# Patient Record
Sex: Male | Born: 1975 | Race: White | Hispanic: No | Marital: Married | Smoking: Former smoker
Health system: Southern US, Community
[De-identification: ages and names within clinical notes are randomized; demographics above are authoritative.]

## PROBLEM LIST (undated history)

## (undated) DIAGNOSIS — H9319 Tinnitus, unspecified ear: Secondary | ICD-10-CM

## (undated) DIAGNOSIS — F32A Depression, unspecified: Secondary | ICD-10-CM

## (undated) DIAGNOSIS — F419 Anxiety disorder, unspecified: Secondary | ICD-10-CM

## (undated) DIAGNOSIS — E785 Hyperlipidemia, unspecified: Secondary | ICD-10-CM

## (undated) HISTORY — DX: Depression, unspecified: F32.A

## (undated) HISTORY — DX: Hyperlipidemia, unspecified: E78.5

## (undated) HISTORY — PX: WISDOM TOOTH EXTRACTION: SHX21

## (undated) HISTORY — PX: ELBOW SURGERY: SHX618

## (undated) HISTORY — PX: OTHER SURGICAL HISTORY: SHX169

## (undated) HISTORY — PX: VASECTOMY: SHX75

## (undated) HISTORY — DX: Tinnitus, unspecified ear: H93.19

## (undated) HISTORY — PX: TOE SURGERY: SHX1073

## (undated) HISTORY — DX: Anxiety disorder, unspecified: F41.9

---

## 2012-08-04 DIAGNOSIS — Z23 Encounter for immunization: Secondary | ICD-10-CM

## 2013-08-19 ENCOUNTER — Ambulatory Visit (INDEPENDENT_AMBULATORY_CARE_PROVIDER_SITE_OTHER): Payer: BC Managed Care – PPO

## 2013-08-19 DIAGNOSIS — Z23 Encounter for immunization: Secondary | ICD-10-CM

## 2017-03-25 DIAGNOSIS — L918 Other hypertrophic disorders of the skin: Secondary | ICD-10-CM | POA: Diagnosis not present

## 2017-03-25 DIAGNOSIS — L820 Inflamed seborrheic keratosis: Secondary | ICD-10-CM | POA: Diagnosis not present

## 2017-04-22 ENCOUNTER — Ambulatory Visit (INDEPENDENT_AMBULATORY_CARE_PROVIDER_SITE_OTHER): Payer: 59 | Admitting: Psychology

## 2017-04-22 DIAGNOSIS — F3341 Major depressive disorder, recurrent, in partial remission: Secondary | ICD-10-CM | POA: Diagnosis not present

## 2017-05-07 ENCOUNTER — Ambulatory Visit (INDEPENDENT_AMBULATORY_CARE_PROVIDER_SITE_OTHER): Payer: 59 | Admitting: Psychology

## 2017-05-07 DIAGNOSIS — F3341 Major depressive disorder, recurrent, in partial remission: Secondary | ICD-10-CM

## 2017-08-18 DIAGNOSIS — Z0000 Encounter for general adult medical examination without abnormal findings: Secondary | ICD-10-CM | POA: Diagnosis not present

## 2017-08-18 DIAGNOSIS — Z125 Encounter for screening for malignant neoplasm of prostate: Secondary | ICD-10-CM | POA: Diagnosis not present

## 2017-08-24 ENCOUNTER — Ambulatory Visit (INDEPENDENT_AMBULATORY_CARE_PROVIDER_SITE_OTHER): Payer: 59 | Admitting: Psychology

## 2017-08-24 DIAGNOSIS — Z0000 Encounter for general adult medical examination without abnormal findings: Secondary | ICD-10-CM | POA: Diagnosis not present

## 2017-08-24 DIAGNOSIS — Z125 Encounter for screening for malignant neoplasm of prostate: Secondary | ICD-10-CM | POA: Diagnosis not present

## 2017-08-24 DIAGNOSIS — Z1389 Encounter for screening for other disorder: Secondary | ICD-10-CM | POA: Diagnosis not present

## 2017-08-24 DIAGNOSIS — F331 Major depressive disorder, recurrent, moderate: Secondary | ICD-10-CM

## 2017-08-24 DIAGNOSIS — Z23 Encounter for immunization: Secondary | ICD-10-CM | POA: Diagnosis not present

## 2017-08-24 DIAGNOSIS — E7849 Other hyperlipidemia: Secondary | ICD-10-CM | POA: Diagnosis not present

## 2017-09-07 ENCOUNTER — Ambulatory Visit (INDEPENDENT_AMBULATORY_CARE_PROVIDER_SITE_OTHER): Payer: 59 | Admitting: Psychology

## 2017-09-07 DIAGNOSIS — F321 Major depressive disorder, single episode, moderate: Secondary | ICD-10-CM | POA: Diagnosis not present

## 2017-09-21 ENCOUNTER — Ambulatory Visit (INDEPENDENT_AMBULATORY_CARE_PROVIDER_SITE_OTHER): Payer: 59 | Admitting: Psychology

## 2017-09-21 DIAGNOSIS — F331 Major depressive disorder, recurrent, moderate: Secondary | ICD-10-CM | POA: Diagnosis not present

## 2017-10-05 ENCOUNTER — Ambulatory Visit (INDEPENDENT_AMBULATORY_CARE_PROVIDER_SITE_OTHER): Payer: 59 | Admitting: Psychology

## 2017-10-05 DIAGNOSIS — F331 Major depressive disorder, recurrent, moderate: Secondary | ICD-10-CM | POA: Diagnosis not present

## 2017-10-12 ENCOUNTER — Ambulatory Visit: Payer: Self-pay | Admitting: Emergency Medicine

## 2017-10-12 VITALS — BP 136/92 | HR 92 | Temp 98.4°F | Resp 18 | Wt 207.6 lb

## 2017-10-12 DIAGNOSIS — H1033 Unspecified acute conjunctivitis, bilateral: Secondary | ICD-10-CM

## 2017-10-12 DIAGNOSIS — L309 Dermatitis, unspecified: Secondary | ICD-10-CM

## 2017-10-12 MED ORDER — TRIAMCINOLONE ACETONIDE 0.1 % EX CREA: 1.0000 "application " | TOPICAL_CREAM | Freq: Two times a day (BID) | CUTANEOUS | 0 refills | Status: DC

## 2017-10-12 MED ORDER — TOBRAMYCIN 0.3 % OP SOLN: 2.0000 [drp] | OPHTHALMIC | 0 refills | Status: DC

## 2017-10-12 NOTE — Patient Instructions (Signed)
Rash A rash is a change in the color of the skin. A rash can also change the way your skin feels. There are many different conditions and factors that can cause a rash. Follow these instructions at home: Pay attention to any changes in your symptoms. Follow these instructions to help with your condition: Medicine Take or apply over-the-counter and prescription medicines only as told by your doctor. These may include:  Corticosteroid cream.  Anti-itch lotions.  Oral antihistamines.  Skin Care  Put cool compresses on the affected areas.  Try taking a bath with: ? Epsom salts. Follow the instructions on the packaging. You can get these at your local pharmacy or grocery store. ? Baking soda. Pour a small amount into the bath as told by your doctor. ? Colloidal oatmeal. Follow the instructions on the packaging. You can get this at your local pharmacy or grocery store.  Try putting baking soda paste onto your skin. Stir water into baking soda until it gets like a paste.  Do not scratch or rub your skin.  Avoid covering the rash. Make sure the rash is exposed to air as much as possible. General instructions  Avoid hot showers or baths, which can make itching worse. A cold shower may help.  Avoid scented soaps, detergents, and perfumes. Use gentle soaps, detergents, perfumes, and other cosmetic products.  Avoid anything that causes your rash. Keep a journal to help track what causes your rash. Write down: ? What you eat. ? What cosmetic products you use. ? What you drink. ? What you wear. This includes jewelry.  Keep all follow-up visits as told by your doctor. This is important. Contact a doctor if:  You sweat at night.  You lose weight.  You pee (urinate) more than normal.  You feel weak.  You throw up (vomit).  Your skin or the whites of your eyes look yellow (jaundice).  Your skin: ? Tingles. ? Is numb.  Your rash: ? Does not go away after a few days. ? Gets  worse.  You are: ? More thirsty than normal. ? More tired than normal.  You have: ? New symptoms. ? Pain in your belly (abdomen). ? A fever. ? Watery poop (diarrhea). Get help right away if:  Your rash covers all or most of your body. The rash may or may not be painful.  You have blisters that: ? Are on top of the rash. ? Grow larger. ? Grow together. ? Are painful. ? Are inside your nose or mouth.  You have a rash that: ? Looks like purple pinprick-sized spots all over your body. ? Has a "bull's eye" or looks like a target. ? Is red and painful, causes your skin to peel, and is not from being in the sun too long. This information is not intended to replace advice given to you by your health care provider. Make sure you discuss any questions you have with your health care provider. Document Released: 04/14/2008 Document Revised: 04/03/2016 Document Reviewed: 03/14/2015 Elsevier Interactive Patient Education  2018 [employer]. Allergic Conjunctivitis A clear membrane (conjunctiva) covers the white part of your eye and the inner surface of your eyelid. Allergic conjunctivitis happens when this membrane has inflammation. This is caused by allergies. Common causes of allergic reactions (allergens)include:  Outdoor allergens, such as: ? Pollen. ? Grass and weeds. ? Mold spores.  Indoor allergens, such as: ? Dust. ? Smoke. ? Mold. ? Pet dander. ? Animal hair.  This condition can make  your eye red or pink. It can also make your eye feel itchy. This condition cannot be spread from one person to another person (is not contagious). Follow these instructions at home:  Try not to be around things that you are allergic to.  Take or apply over-the-counter and prescription medicines only as told by your doctor. These include any eye drops.  Place a cool, clean washcloth on your eye for 10-20 minutes. Do this 3-4 times a day.  Do not touch or rub your eyes.  Do not wear  contact lenses until the inflammation is gone. Wear glasses instead.  Do not wear eye makeup until the inflammation is gone.  Keep all follow-up visits as told by your doctor. This is important. Contact a doctor if:  Your symptoms get worse.  Your symptoms do not get better with treatment.  You have mild eye pain.  You are sensitive to light,  You have spots or blisters on your eyes.  You have pus coming from your eye.  You have a fever. Get help right away if:  You have redness, swelling, or other symptoms in only one eye.  Your vision is blurry.  You have vision changes.  You have very bad eye pain. Summary  Allergic conjunctivitis is caused by allergies. It can make your eye red or pink, and it can make your eye feel itchy.  This condition cannot be spread from one person to another person (is not contagious).  Try not to be around things that you are allergic to.  Take or apply over-the-counter and prescription medicines only as told by your doctor. These include any eye drops.  Contact your doctor if your symptoms get worse or they do not get better with treatment. This information is not intended to replace advice given to you by your health care provider. Make sure you discuss any questions you have with your health care provider. Document Released: 04/16/2010 Document Revised: 06/20/2016 Document Reviewed: 06/20/2016 Elsevier Interactive Patient Education  2017 [employer].

## 2017-10-12 NOTE — Progress Notes (Signed)
Subjective:    Joel Mayer is a 41 y.o. male who presents for evaluation of discharge, erythema, tearing and crusting in both eyes. He has noticed the above symptoms for 2 days. Onset was acute. Patient denies blurred vision, foreign body sensation, pain and visual field deficit. There is a history of allergies. He does not wear glasses. He reports his daughter has conjunctivitis and is being treated with antibiotic eye drops for 3 days.  He has a secondary complaint of rash to the left antecubital area following giving blood last week. Describes it as itchy, denies pain. No new soaps, detergents, cloths, or other sources of allergens.    Review of Systems Pertinent items are noted in HPI.   Objective:    BP (!) 136/92 (BP Location: Right Arm, Patient Position: Sitting, Cuff Size: Normal)   Pulse 92   Temp 98.4 F (36.9 C) (Oral)   Resp 18   Wt 207 lb 9.6 oz (94.2 kg)   SpO2 98%        Physical Exam  Constitutional: He is well-developed, well-nourished, and in no distress. No distress.  HENT:  Head: Normocephalic and atraumatic.  Right Ear: External ear normal.  Left Ear: External ear normal.  Eyes: EOM are normal. Pupils are equal, round, and reactive to light. Right eye exhibits no chemosis and no discharge. Left eye exhibits no chemosis and no discharge. Right conjunctiva is injected. Right conjunctiva has no hemorrhage. Left conjunctiva is injected. Left conjunctiva has no hemorrhage.  Visual acuity  L 20/20 R 20/20 B 20/10  Neck: Neck supple.  Cardiovascular: Regular rhythm.  Lymphadenopathy:    He has no cervical adenopathy.  Skin: Skin is warm and dry. Rash noted. Rash is maculopapular. He is not diaphoretic.  Nursing note and vitals reviewed.    Assessment:      1. Acute bacterial conjunctivitis of both eyes   2. Dermatitis      Plan:   1. Acute bacterial conjunctivitis of both eyes - tobramycin (TOBREX) 0.3 % ophthalmic solution; Place 2 drops into both  eyes every 4 (four) hours. While awake  Dispense: 5 mL; Refill: 0  2. Dermatitis - triamcinolone cream (KENALOG) 0.1 %; Apply 1 application topically 2 (two) times daily. Apply to the affected area twice daily.  Dispense: 30 g; Refill: 0

## 2017-10-14 ENCOUNTER — Telehealth: Payer: Self-pay | Admitting: Emergency Medicine

## 2017-10-19 ENCOUNTER — Ambulatory Visit: Payer: 59 | Admitting: Psychology

## 2017-11-16 ENCOUNTER — Ambulatory Visit (INDEPENDENT_AMBULATORY_CARE_PROVIDER_SITE_OTHER): Payer: 59 | Admitting: Psychology

## 2017-11-16 DIAGNOSIS — F331 Major depressive disorder, recurrent, moderate: Secondary | ICD-10-CM | POA: Diagnosis not present

## 2017-11-30 ENCOUNTER — Ambulatory Visit: Payer: 59 | Admitting: Psychology

## 2017-12-06 DIAGNOSIS — M25561 Pain in right knee: Secondary | ICD-10-CM | POA: Diagnosis not present

## 2017-12-08 DIAGNOSIS — M25561 Pain in right knee: Secondary | ICD-10-CM | POA: Diagnosis not present

## 2017-12-10 DIAGNOSIS — M25561 Pain in right knee: Secondary | ICD-10-CM | POA: Diagnosis not present

## 2017-12-14 ENCOUNTER — Ambulatory Visit (INDEPENDENT_AMBULATORY_CARE_PROVIDER_SITE_OTHER): Payer: 59 | Admitting: Psychology

## 2017-12-14 DIAGNOSIS — F331 Major depressive disorder, recurrent, moderate: Secondary | ICD-10-CM

## 2017-12-24 DIAGNOSIS — E7849 Other hyperlipidemia: Secondary | ICD-10-CM | POA: Diagnosis not present

## 2018-01-11 ENCOUNTER — Ambulatory Visit (INDEPENDENT_AMBULATORY_CARE_PROVIDER_SITE_OTHER): Payer: 59 | Admitting: Psychology

## 2018-01-11 DIAGNOSIS — F331 Major depressive disorder, recurrent, moderate: Secondary | ICD-10-CM

## 2018-01-25 ENCOUNTER — Ambulatory Visit (INDEPENDENT_AMBULATORY_CARE_PROVIDER_SITE_OTHER): Payer: 59 | Admitting: Psychology

## 2018-01-25 DIAGNOSIS — F331 Major depressive disorder, recurrent, moderate: Secondary | ICD-10-CM

## 2018-02-08 ENCOUNTER — Ambulatory Visit (INDEPENDENT_AMBULATORY_CARE_PROVIDER_SITE_OTHER): Payer: 59 | Admitting: Psychology

## 2018-02-08 DIAGNOSIS — F331 Major depressive disorder, recurrent, moderate: Secondary | ICD-10-CM

## 2018-02-15 DIAGNOSIS — M6740 Ganglion, unspecified site: Secondary | ICD-10-CM | POA: Diagnosis not present

## 2018-02-15 DIAGNOSIS — E7849 Other hyperlipidemia: Secondary | ICD-10-CM | POA: Diagnosis not present

## 2018-02-22 ENCOUNTER — Ambulatory Visit (INDEPENDENT_AMBULATORY_CARE_PROVIDER_SITE_OTHER): Payer: 59 | Admitting: Psychology

## 2018-02-22 DIAGNOSIS — F331 Major depressive disorder, recurrent, moderate: Secondary | ICD-10-CM

## 2018-02-23 ENCOUNTER — Ambulatory Visit (INDEPENDENT_AMBULATORY_CARE_PROVIDER_SITE_OTHER): Payer: 59 | Admitting: Licensed Clinical Social Worker

## 2018-02-23 DIAGNOSIS — F324 Major depressive disorder, single episode, in partial remission: Secondary | ICD-10-CM

## 2018-03-08 ENCOUNTER — Ambulatory Visit (INDEPENDENT_AMBULATORY_CARE_PROVIDER_SITE_OTHER): Payer: 59 | Admitting: Psychology

## 2018-03-08 DIAGNOSIS — F331 Major depressive disorder, recurrent, moderate: Secondary | ICD-10-CM | POA: Diagnosis not present

## 2018-03-10 ENCOUNTER — Ambulatory Visit: Payer: 59 | Admitting: Licensed Clinical Social Worker

## 2018-03-22 ENCOUNTER — Ambulatory Visit (INDEPENDENT_AMBULATORY_CARE_PROVIDER_SITE_OTHER): Payer: 59 | Admitting: Psychology

## 2018-03-22 DIAGNOSIS — F331 Major depressive disorder, recurrent, moderate: Secondary | ICD-10-CM

## 2018-04-13 ENCOUNTER — Ambulatory Visit (INDEPENDENT_AMBULATORY_CARE_PROVIDER_SITE_OTHER): Payer: 59 | Admitting: Licensed Clinical Social Worker

## 2018-04-13 DIAGNOSIS — F324 Major depressive disorder, single episode, in partial remission: Secondary | ICD-10-CM

## 2018-04-15 DIAGNOSIS — M7711 Lateral epicondylitis, right elbow: Secondary | ICD-10-CM | POA: Diagnosis not present

## 2018-04-19 ENCOUNTER — Ambulatory Visit (INDEPENDENT_AMBULATORY_CARE_PROVIDER_SITE_OTHER): Payer: 59 | Admitting: Psychology

## 2018-04-19 DIAGNOSIS — F331 Major depressive disorder, recurrent, moderate: Secondary | ICD-10-CM

## 2018-04-29 ENCOUNTER — Ambulatory Visit: Payer: 59 | Admitting: Licensed Clinical Social Worker

## 2018-05-03 ENCOUNTER — Ambulatory Visit (INDEPENDENT_AMBULATORY_CARE_PROVIDER_SITE_OTHER): Payer: 59 | Admitting: Psychology

## 2018-05-03 DIAGNOSIS — F331 Major depressive disorder, recurrent, moderate: Secondary | ICD-10-CM | POA: Diagnosis not present

## 2018-05-10 ENCOUNTER — Ambulatory Visit (INDEPENDENT_AMBULATORY_CARE_PROVIDER_SITE_OTHER): Payer: 59 | Admitting: Licensed Clinical Social Worker

## 2018-05-10 DIAGNOSIS — F324 Major depressive disorder, single episode, in partial remission: Secondary | ICD-10-CM

## 2018-05-17 ENCOUNTER — Ambulatory Visit: Payer: Self-pay | Admitting: Psychology

## 2018-05-27 ENCOUNTER — Ambulatory Visit: Payer: 59 | Admitting: Licensed Clinical Social Worker

## 2018-05-31 ENCOUNTER — Ambulatory Visit (INDEPENDENT_AMBULATORY_CARE_PROVIDER_SITE_OTHER): Payer: 59 | Admitting: Psychology

## 2018-05-31 DIAGNOSIS — F331 Major depressive disorder, recurrent, moderate: Secondary | ICD-10-CM | POA: Diagnosis not present

## 2018-06-14 ENCOUNTER — Ambulatory Visit: Payer: 59 | Admitting: Psychology

## 2018-06-24 NOTE — Telephone Encounter (Signed)
error 

## 2018-06-28 ENCOUNTER — Ambulatory Visit (INDEPENDENT_AMBULATORY_CARE_PROVIDER_SITE_OTHER): Payer: 59 | Admitting: Psychology

## 2018-06-28 DIAGNOSIS — F331 Major depressive disorder, recurrent, moderate: Secondary | ICD-10-CM | POA: Diagnosis not present

## 2018-07-08 DIAGNOSIS — M7711 Lateral epicondylitis, right elbow: Secondary | ICD-10-CM | POA: Diagnosis not present

## 2018-07-13 DIAGNOSIS — M25521 Pain in right elbow: Secondary | ICD-10-CM | POA: Diagnosis not present

## 2018-07-22 DIAGNOSIS — M7711 Lateral epicondylitis, right elbow: Secondary | ICD-10-CM | POA: Diagnosis not present

## 2018-07-26 ENCOUNTER — Ambulatory Visit (INDEPENDENT_AMBULATORY_CARE_PROVIDER_SITE_OTHER): Payer: 59 | Admitting: Psychology

## 2018-07-26 DIAGNOSIS — F331 Major depressive disorder, recurrent, moderate: Secondary | ICD-10-CM | POA: Diagnosis not present

## 2018-08-09 ENCOUNTER — Ambulatory Visit (INDEPENDENT_AMBULATORY_CARE_PROVIDER_SITE_OTHER): Payer: 59 | Admitting: Psychology

## 2018-08-09 DIAGNOSIS — F331 Major depressive disorder, recurrent, moderate: Secondary | ICD-10-CM | POA: Diagnosis not present

## 2018-08-23 ENCOUNTER — Ambulatory Visit (INDEPENDENT_AMBULATORY_CARE_PROVIDER_SITE_OTHER): Payer: 59 | Admitting: Psychology

## 2018-08-23 DIAGNOSIS — F331 Major depressive disorder, recurrent, moderate: Secondary | ICD-10-CM

## 2018-08-24 DIAGNOSIS — R82998 Other abnormal findings in urine: Secondary | ICD-10-CM | POA: Diagnosis not present

## 2018-08-24 DIAGNOSIS — Z0000 Encounter for general adult medical examination without abnormal findings: Secondary | ICD-10-CM | POA: Diagnosis not present

## 2018-08-24 DIAGNOSIS — E7849 Other hyperlipidemia: Secondary | ICD-10-CM | POA: Diagnosis not present

## 2018-08-30 DIAGNOSIS — E663 Overweight: Secondary | ICD-10-CM | POA: Diagnosis not present

## 2018-08-30 DIAGNOSIS — Z1389 Encounter for screening for other disorder: Secondary | ICD-10-CM | POA: Diagnosis not present

## 2018-08-30 DIAGNOSIS — Z0000 Encounter for general adult medical examination without abnormal findings: Secondary | ICD-10-CM | POA: Diagnosis not present

## 2018-08-30 DIAGNOSIS — E7849 Other hyperlipidemia: Secondary | ICD-10-CM | POA: Diagnosis not present

## 2018-08-31 ENCOUNTER — Other Ambulatory Visit: Payer: Self-pay | Admitting: Internal Medicine

## 2018-08-31 DIAGNOSIS — E785 Hyperlipidemia, unspecified: Secondary | ICD-10-CM

## 2018-09-06 ENCOUNTER — Ambulatory Visit (INDEPENDENT_AMBULATORY_CARE_PROVIDER_SITE_OTHER): Payer: 59 | Admitting: Psychology

## 2018-09-06 DIAGNOSIS — F331 Major depressive disorder, recurrent, moderate: Secondary | ICD-10-CM | POA: Diagnosis not present

## 2018-09-07 ENCOUNTER — Ambulatory Visit
Admission: RE | Admit: 2018-09-07 | Discharge: 2018-09-07 | Disposition: A | Discharge status: Home / Self Care | Payer: No Typology Code available for payment source | Source: Ambulatory Visit | Attending: Internal Medicine | Admitting: Internal Medicine

## 2018-09-07 DIAGNOSIS — E785 Hyperlipidemia, unspecified: Secondary | ICD-10-CM

## 2018-09-20 ENCOUNTER — Ambulatory Visit (INDEPENDENT_AMBULATORY_CARE_PROVIDER_SITE_OTHER): Payer: 59 | Admitting: Psychology

## 2018-09-20 DIAGNOSIS — F331 Major depressive disorder, recurrent, moderate: Secondary | ICD-10-CM | POA: Diagnosis not present

## 2018-10-04 ENCOUNTER — Ambulatory Visit (INDEPENDENT_AMBULATORY_CARE_PROVIDER_SITE_OTHER): Payer: 59 | Admitting: Psychology

## 2018-10-04 DIAGNOSIS — F331 Major depressive disorder, recurrent, moderate: Secondary | ICD-10-CM

## 2018-10-18 ENCOUNTER — Ambulatory Visit (INDEPENDENT_AMBULATORY_CARE_PROVIDER_SITE_OTHER): Payer: 59 | Admitting: Psychology

## 2018-10-18 DIAGNOSIS — F331 Major depressive disorder, recurrent, moderate: Secondary | ICD-10-CM | POA: Diagnosis not present

## 2018-10-19 DIAGNOSIS — M7711 Lateral epicondylitis, right elbow: Secondary | ICD-10-CM | POA: Diagnosis not present

## 2018-11-29 ENCOUNTER — Ambulatory Visit (INDEPENDENT_AMBULATORY_CARE_PROVIDER_SITE_OTHER): Payer: 59 | Admitting: Psychology

## 2018-11-29 DIAGNOSIS — F331 Major depressive disorder, recurrent, moderate: Secondary | ICD-10-CM

## 2018-12-08 DIAGNOSIS — M7711 Lateral epicondylitis, right elbow: Secondary | ICD-10-CM | POA: Diagnosis not present

## 2018-12-13 ENCOUNTER — Ambulatory Visit (INDEPENDENT_AMBULATORY_CARE_PROVIDER_SITE_OTHER): Payer: 59 | Admitting: Psychology

## 2018-12-13 DIAGNOSIS — F331 Major depressive disorder, recurrent, moderate: Secondary | ICD-10-CM

## 2018-12-16 DIAGNOSIS — M7711 Lateral epicondylitis, right elbow: Secondary | ICD-10-CM | POA: Diagnosis not present

## 2018-12-27 ENCOUNTER — Ambulatory Visit (INDEPENDENT_AMBULATORY_CARE_PROVIDER_SITE_OTHER): Payer: 59 | Admitting: Psychology

## 2018-12-27 DIAGNOSIS — F331 Major depressive disorder, recurrent, moderate: Secondary | ICD-10-CM | POA: Diagnosis not present

## 2019-01-03 DIAGNOSIS — M25621 Stiffness of right elbow, not elsewhere classified: Secondary | ICD-10-CM | POA: Diagnosis not present

## 2019-01-03 DIAGNOSIS — M25521 Pain in right elbow: Secondary | ICD-10-CM | POA: Diagnosis not present

## 2019-01-03 DIAGNOSIS — M6281 Muscle weakness (generalized): Secondary | ICD-10-CM | POA: Diagnosis not present

## 2019-01-10 ENCOUNTER — Ambulatory Visit (INDEPENDENT_AMBULATORY_CARE_PROVIDER_SITE_OTHER): Payer: 59 | Admitting: Psychology

## 2019-01-10 DIAGNOSIS — F331 Major depressive disorder, recurrent, moderate: Secondary | ICD-10-CM | POA: Diagnosis not present

## 2019-01-20 DIAGNOSIS — M25521 Pain in right elbow: Secondary | ICD-10-CM | POA: Diagnosis not present

## 2019-01-20 DIAGNOSIS — M6281 Muscle weakness (generalized): Secondary | ICD-10-CM | POA: Diagnosis not present

## 2019-01-20 DIAGNOSIS — M25621 Stiffness of right elbow, not elsewhere classified: Secondary | ICD-10-CM | POA: Diagnosis not present

## 2019-01-24 ENCOUNTER — Ambulatory Visit (INDEPENDENT_AMBULATORY_CARE_PROVIDER_SITE_OTHER): Payer: 59 | Admitting: Psychology

## 2019-01-24 DIAGNOSIS — F331 Major depressive disorder, recurrent, moderate: Secondary | ICD-10-CM | POA: Diagnosis not present

## 2019-02-07 ENCOUNTER — Ambulatory Visit (INDEPENDENT_AMBULATORY_CARE_PROVIDER_SITE_OTHER): Payer: 59 | Admitting: Psychology

## 2019-02-07 DIAGNOSIS — F331 Major depressive disorder, recurrent, moderate: Secondary | ICD-10-CM | POA: Diagnosis not present

## 2019-03-07 ENCOUNTER — Ambulatory Visit (INDEPENDENT_AMBULATORY_CARE_PROVIDER_SITE_OTHER): Payer: 59 | Admitting: Psychology

## 2019-03-07 DIAGNOSIS — F331 Major depressive disorder, recurrent, moderate: Secondary | ICD-10-CM | POA: Diagnosis not present

## 2019-03-21 ENCOUNTER — Ambulatory Visit (INDEPENDENT_AMBULATORY_CARE_PROVIDER_SITE_OTHER): Payer: 59 | Admitting: Psychology

## 2019-03-21 DIAGNOSIS — F331 Major depressive disorder, recurrent, moderate: Secondary | ICD-10-CM | POA: Diagnosis not present

## 2019-04-07 ENCOUNTER — Ambulatory Visit (INDEPENDENT_AMBULATORY_CARE_PROVIDER_SITE_OTHER): Payer: 59 | Admitting: Psychology

## 2019-04-07 DIAGNOSIS — F331 Major depressive disorder, recurrent, moderate: Secondary | ICD-10-CM

## 2019-04-18 ENCOUNTER — Ambulatory Visit: Payer: 59 | Admitting: Psychology

## 2019-04-20 ENCOUNTER — Ambulatory Visit (INDEPENDENT_AMBULATORY_CARE_PROVIDER_SITE_OTHER): Payer: 59 | Admitting: Psychology

## 2019-04-20 DIAGNOSIS — F331 Major depressive disorder, recurrent, moderate: Secondary | ICD-10-CM

## 2019-05-02 ENCOUNTER — Ambulatory Visit: Payer: 59 | Admitting: Psychology

## 2019-05-10 ENCOUNTER — Ambulatory Visit (INDEPENDENT_AMBULATORY_CARE_PROVIDER_SITE_OTHER): Payer: 59 | Admitting: Psychology

## 2019-05-10 DIAGNOSIS — F331 Major depressive disorder, recurrent, moderate: Secondary | ICD-10-CM | POA: Diagnosis not present

## 2019-05-16 ENCOUNTER — Ambulatory Visit (INDEPENDENT_AMBULATORY_CARE_PROVIDER_SITE_OTHER): Payer: 59 | Admitting: Psychology

## 2019-05-16 DIAGNOSIS — F331 Major depressive disorder, recurrent, moderate: Secondary | ICD-10-CM | POA: Diagnosis not present

## 2019-05-30 ENCOUNTER — Ambulatory Visit (INDEPENDENT_AMBULATORY_CARE_PROVIDER_SITE_OTHER): Payer: 59 | Admitting: Psychology

## 2019-05-30 DIAGNOSIS — F331 Major depressive disorder, recurrent, moderate: Secondary | ICD-10-CM | POA: Diagnosis not present

## 2019-05-31 ENCOUNTER — Other Ambulatory Visit: Payer: Self-pay

## 2019-05-31 DIAGNOSIS — Z20822 Contact with and (suspected) exposure to covid-19: Secondary | ICD-10-CM

## 2019-06-02 LAB — NOVEL CORONAVIRUS, NAA: SARS-CoV-2, NAA: NOT DETECTED

## 2019-06-03 ENCOUNTER — Other Ambulatory Visit: Payer: Self-pay

## 2019-06-03 DIAGNOSIS — Z20822 Contact with and (suspected) exposure to covid-19: Secondary | ICD-10-CM

## 2019-06-07 LAB — NOVEL CORONAVIRUS, NAA: SARS-CoV-2, NAA: NOT DETECTED

## 2019-06-13 ENCOUNTER — Ambulatory Visit: Payer: 59 | Admitting: Psychology

## 2019-06-27 ENCOUNTER — Ambulatory Visit (INDEPENDENT_AMBULATORY_CARE_PROVIDER_SITE_OTHER): Payer: 59 | Admitting: Psychology

## 2019-06-27 DIAGNOSIS — F331 Major depressive disorder, recurrent, moderate: Secondary | ICD-10-CM | POA: Diagnosis not present

## 2019-07-11 ENCOUNTER — Ambulatory Visit (INDEPENDENT_AMBULATORY_CARE_PROVIDER_SITE_OTHER): Payer: 59 | Admitting: Psychology

## 2019-07-11 DIAGNOSIS — F331 Major depressive disorder, recurrent, moderate: Secondary | ICD-10-CM | POA: Diagnosis not present

## 2019-07-25 ENCOUNTER — Ambulatory Visit (INDEPENDENT_AMBULATORY_CARE_PROVIDER_SITE_OTHER): Payer: 59 | Admitting: Psychology

## 2019-07-25 DIAGNOSIS — F331 Major depressive disorder, recurrent, moderate: Secondary | ICD-10-CM

## 2019-08-08 ENCOUNTER — Ambulatory Visit (INDEPENDENT_AMBULATORY_CARE_PROVIDER_SITE_OTHER): Payer: 59 | Admitting: Psychology

## 2019-08-08 DIAGNOSIS — F331 Major depressive disorder, recurrent, moderate: Secondary | ICD-10-CM

## 2019-08-22 ENCOUNTER — Ambulatory Visit (INDEPENDENT_AMBULATORY_CARE_PROVIDER_SITE_OTHER): Payer: 59 | Admitting: Psychology

## 2019-08-22 DIAGNOSIS — F331 Major depressive disorder, recurrent, moderate: Secondary | ICD-10-CM | POA: Diagnosis not present

## 2019-09-05 ENCOUNTER — Ambulatory Visit (INDEPENDENT_AMBULATORY_CARE_PROVIDER_SITE_OTHER): Payer: 59 | Admitting: Psychology

## 2019-09-05 DIAGNOSIS — F331 Major depressive disorder, recurrent, moderate: Secondary | ICD-10-CM | POA: Diagnosis not present

## 2019-09-19 ENCOUNTER — Ambulatory Visit (INDEPENDENT_AMBULATORY_CARE_PROVIDER_SITE_OTHER): Payer: 59 | Admitting: Psychology

## 2019-09-19 DIAGNOSIS — F330 Major depressive disorder, recurrent, mild: Secondary | ICD-10-CM

## 2019-09-28 IMAGING — CT CT HEART SCORING
3 series · 12 of 20 positions shown, 14 images · non-contrast
Comparison: None.

CLINICAL DATA: Hyperlipidemia

EXAM:
CT HEART FOR CALCIUM SCORING
TECHNIQUE: CT heart was performed on a 64 channel system using prospective ECG
gating.
A non-contrast exam for calcium scoring was performed.
Note that this exam targets the heart and the chest was not imaged
in its entirety.

[Series 2: calcium scoring 2.00 qr36 bestdiast 71% · axial · 0.42mm/px · z∈[+1750,+1786]mm · 2 of 90 slices shown]
[im 18/90  vessel]
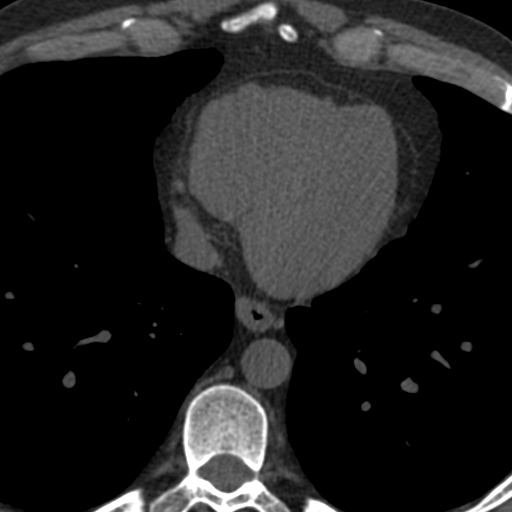
[im 36/90  vessel]
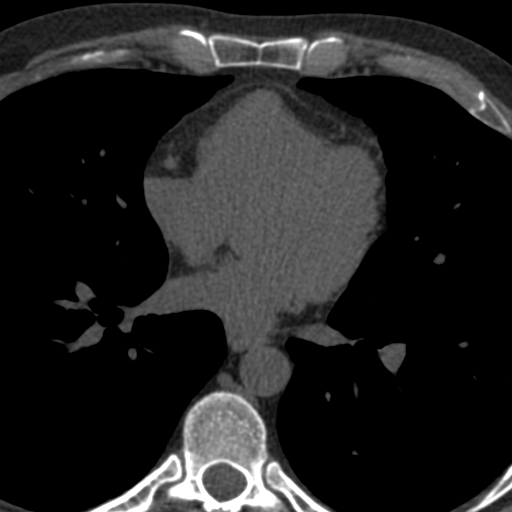

[Series 3: calcium scoring 2.00 br40 bestdiast 71% ax fov · axial · 0.51mm/px · z∈[+1744,+1864]mm · 5 of 90 slices shown, 7 images]
[im 15/90  vessel]
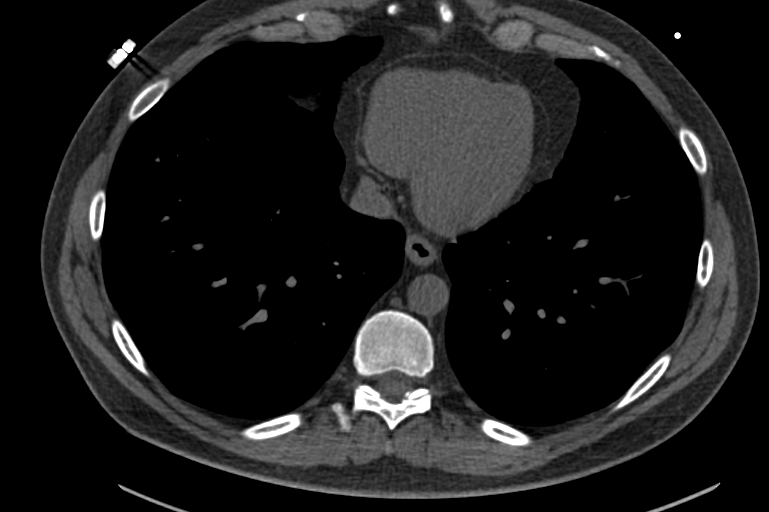
[im 15/90  lung]
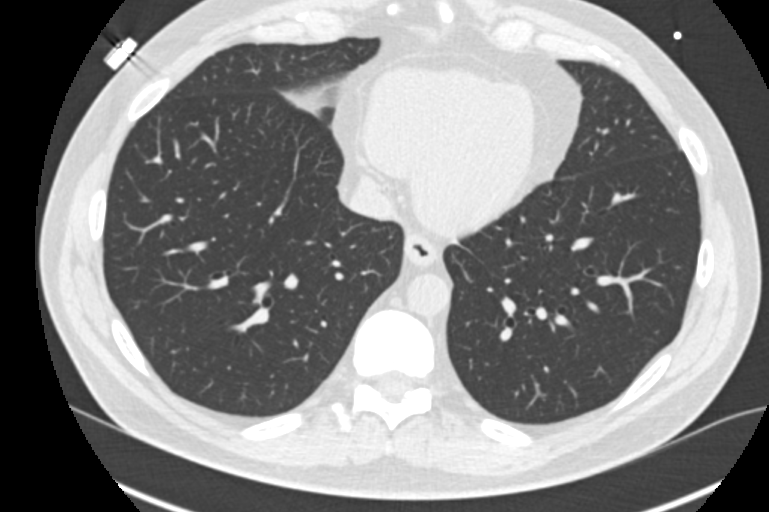
[im 30/90  vessel]
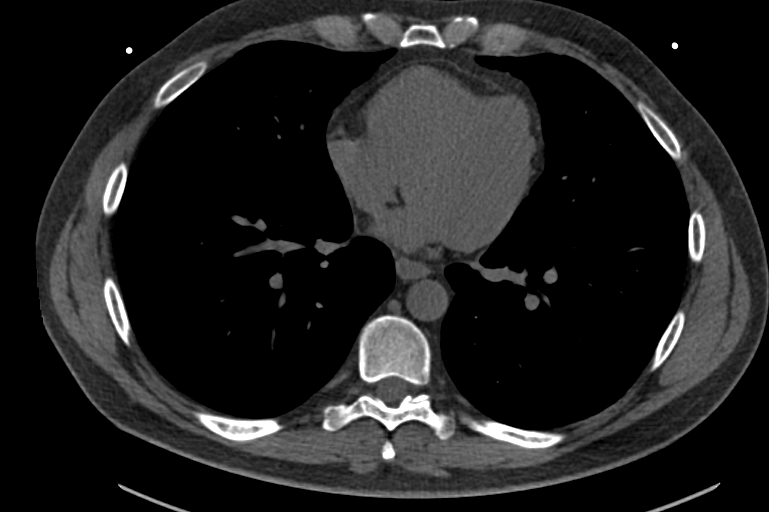
[im 45/90  vessel]
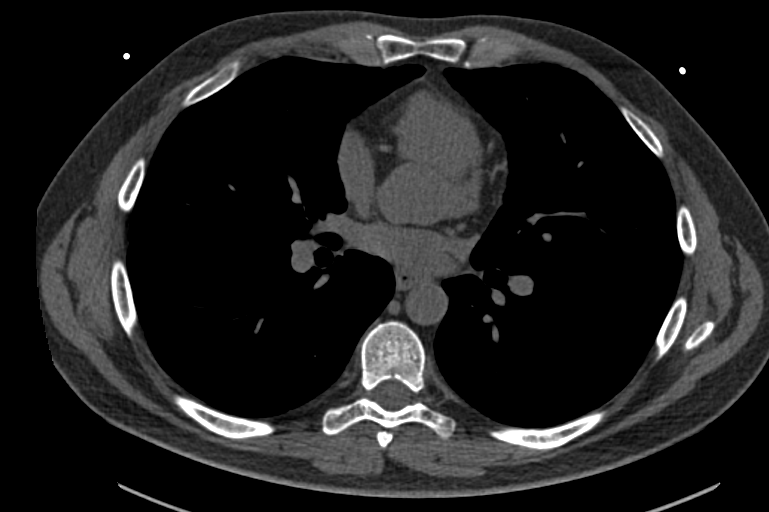
[im 60/90  vessel]
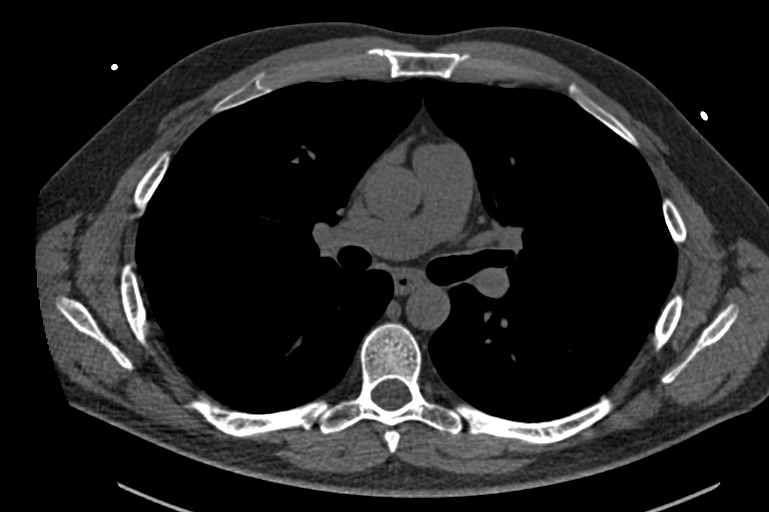
[im 75/90  vessel]
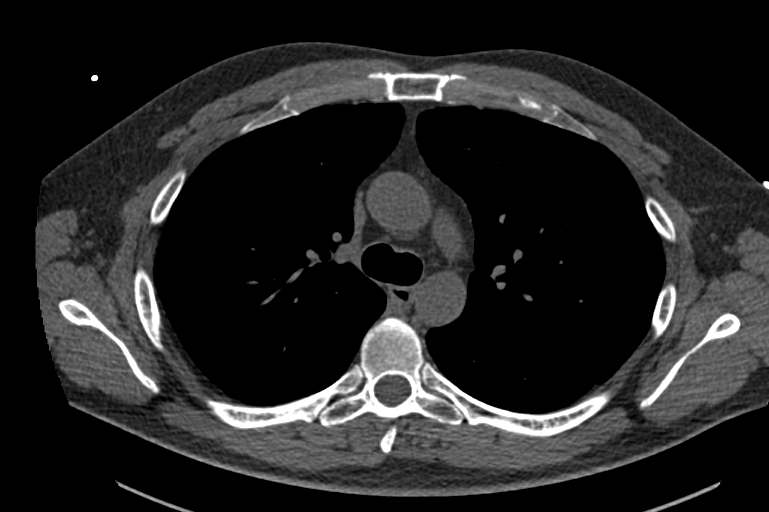
[im 75/90  lung]
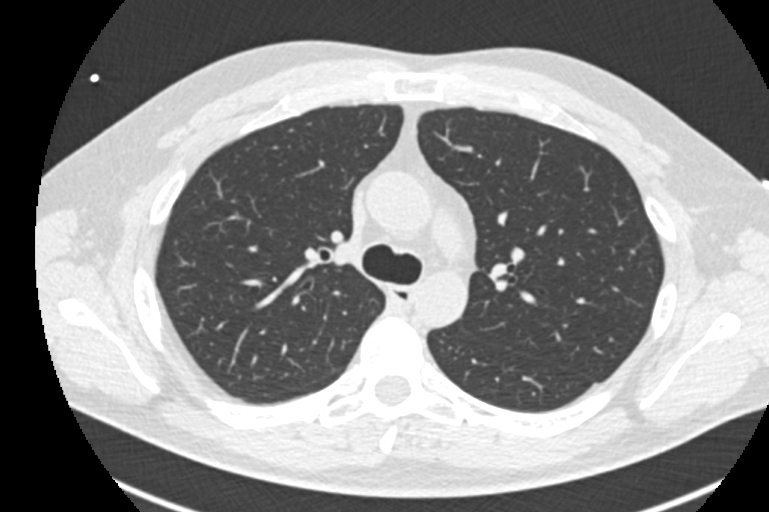

[Series 9: calcium scoring 2.00 br60 bestdiast 71% ax fov · axial · 0.51mm/px · z∈[+1744,+1864]mm · 5 of 90 slices shown]
[im 15/90  vessel]
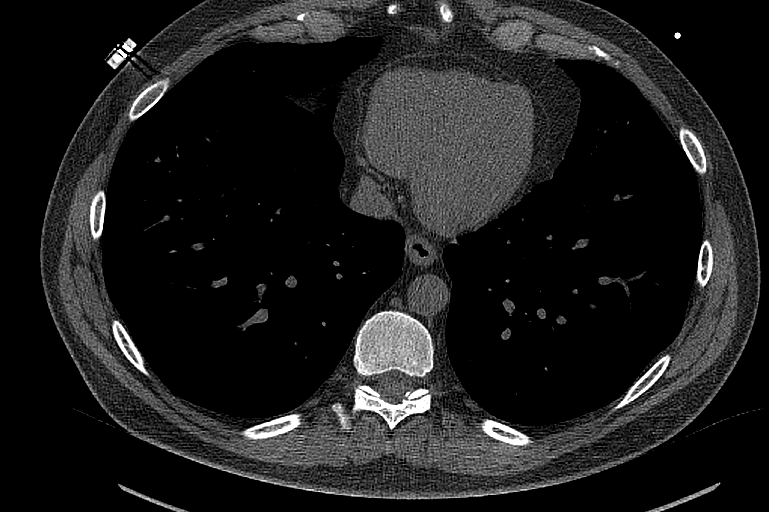
[im 30/90  vessel]
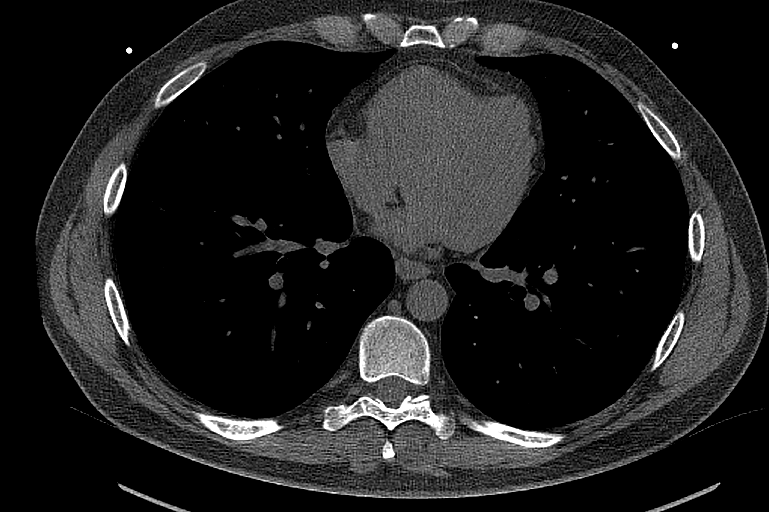
[im 45/90  vessel]
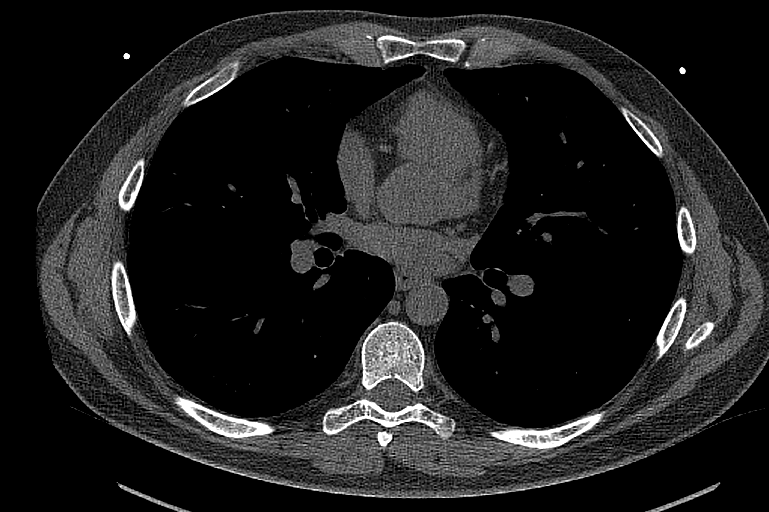
[im 60/90  vessel]
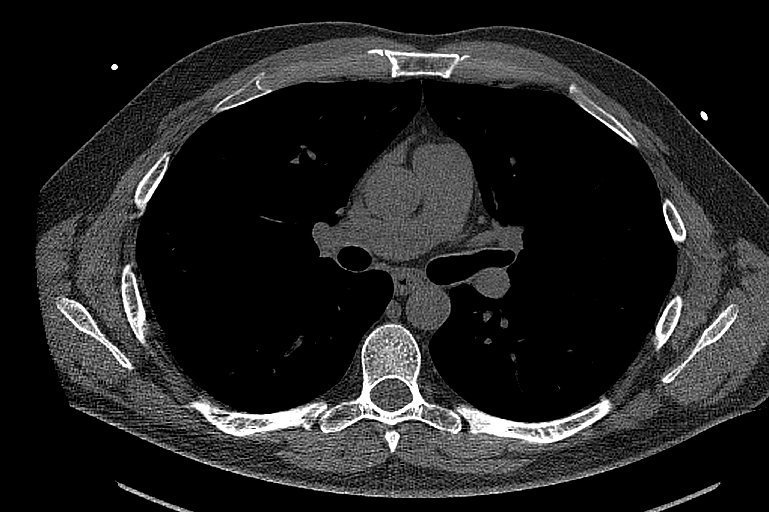
[im 75/90  vessel]
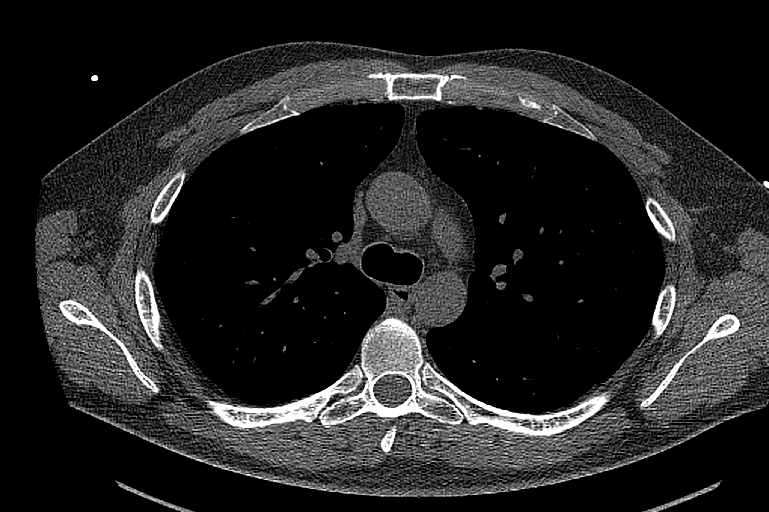

[12 of 20 positions shown; findings below may reference images not displayed]

FINDINGS: Technical quality: Good.

CORONARY CALCIUM

Total Agatston Score: 0

[HOSPITAL] percentile:  0

OTHER FINDINGS:

Cardiovascular: Heart is normal size. Visualized aorta is normal
caliber.

Mediastinum/Nodes: No adenopathy in the visualized mediastinum or
hila.

Lungs/Pleura: Visualized lungs clear.  No effusions.

Upper Abdomen: Imaging into the upper abdomen shows no acute
findings.

Musculoskeletal: Chest wall soft tissues are unremarkable. No acute
bony abnormality.
IMPRESSION: No visible coronary artery calcifications. Total coronary calcium
score of 0.

No acute or significant extracardiac abnormality.

## 2019-10-03 ENCOUNTER — Ambulatory Visit (INDEPENDENT_AMBULATORY_CARE_PROVIDER_SITE_OTHER): Payer: 59 | Admitting: Psychology

## 2019-10-03 DIAGNOSIS — F330 Major depressive disorder, recurrent, mild: Secondary | ICD-10-CM | POA: Diagnosis not present

## 2019-10-17 ENCOUNTER — Ambulatory Visit (INDEPENDENT_AMBULATORY_CARE_PROVIDER_SITE_OTHER): Payer: 59 | Admitting: Psychology

## 2019-10-17 DIAGNOSIS — F330 Major depressive disorder, recurrent, mild: Secondary | ICD-10-CM | POA: Diagnosis not present

## 2019-10-31 ENCOUNTER — Ambulatory Visit: Payer: 59 | Admitting: Psychology

## 2019-11-14 ENCOUNTER — Ambulatory Visit (INDEPENDENT_AMBULATORY_CARE_PROVIDER_SITE_OTHER): Payer: 59 | Admitting: Psychology

## 2019-11-14 DIAGNOSIS — F330 Major depressive disorder, recurrent, mild: Secondary | ICD-10-CM | POA: Diagnosis not present

## 2019-11-28 ENCOUNTER — Ambulatory Visit (INDEPENDENT_AMBULATORY_CARE_PROVIDER_SITE_OTHER): Payer: 59 | Admitting: Psychology

## 2019-11-28 DIAGNOSIS — F330 Major depressive disorder, recurrent, mild: Secondary | ICD-10-CM | POA: Diagnosis not present

## 2019-12-12 ENCOUNTER — Ambulatory Visit (INDEPENDENT_AMBULATORY_CARE_PROVIDER_SITE_OTHER): Payer: 59 | Admitting: Psychology

## 2019-12-12 DIAGNOSIS — F330 Major depressive disorder, recurrent, mild: Secondary | ICD-10-CM

## 2019-12-26 ENCOUNTER — Ambulatory Visit (INDEPENDENT_AMBULATORY_CARE_PROVIDER_SITE_OTHER): Payer: 59 | Admitting: Psychology

## 2019-12-26 DIAGNOSIS — F331 Major depressive disorder, recurrent, moderate: Secondary | ICD-10-CM | POA: Diagnosis not present

## 2020-01-09 ENCOUNTER — Ambulatory Visit (INDEPENDENT_AMBULATORY_CARE_PROVIDER_SITE_OTHER): Payer: 59 | Admitting: Psychology

## 2020-01-09 DIAGNOSIS — F330 Major depressive disorder, recurrent, mild: Secondary | ICD-10-CM | POA: Diagnosis not present

## 2020-01-23 ENCOUNTER — Ambulatory Visit (INDEPENDENT_AMBULATORY_CARE_PROVIDER_SITE_OTHER): Payer: 59 | Admitting: Psychology

## 2020-01-23 DIAGNOSIS — F330 Major depressive disorder, recurrent, mild: Secondary | ICD-10-CM

## 2020-02-02 ENCOUNTER — Ambulatory Visit (INDEPENDENT_AMBULATORY_CARE_PROVIDER_SITE_OTHER): Payer: 59 | Admitting: Psychology

## 2020-02-02 DIAGNOSIS — F331 Major depressive disorder, recurrent, moderate: Secondary | ICD-10-CM | POA: Diagnosis not present

## 2020-02-03 ENCOUNTER — Ambulatory Visit: Payer: Self-pay | Attending: Internal Medicine

## 2020-02-06 ENCOUNTER — Ambulatory Visit: Payer: 59 | Admitting: Psychology

## 2020-02-20 ENCOUNTER — Ambulatory Visit: Payer: 59 | Admitting: Psychology

## 2020-02-21 ENCOUNTER — Ambulatory Visit (INDEPENDENT_AMBULATORY_CARE_PROVIDER_SITE_OTHER): Payer: 59 | Admitting: Psychology

## 2020-02-21 DIAGNOSIS — F330 Major depressive disorder, recurrent, mild: Secondary | ICD-10-CM

## 2020-03-05 ENCOUNTER — Ambulatory Visit (INDEPENDENT_AMBULATORY_CARE_PROVIDER_SITE_OTHER): Payer: 59 | Admitting: Psychology

## 2020-03-05 DIAGNOSIS — F330 Major depressive disorder, recurrent, mild: Secondary | ICD-10-CM | POA: Diagnosis not present

## 2020-03-19 ENCOUNTER — Ambulatory Visit (INDEPENDENT_AMBULATORY_CARE_PROVIDER_SITE_OTHER): Payer: 59 | Admitting: Psychology

## 2020-03-19 DIAGNOSIS — F330 Major depressive disorder, recurrent, mild: Secondary | ICD-10-CM | POA: Diagnosis not present

## 2020-04-02 ENCOUNTER — Ambulatory Visit (INDEPENDENT_AMBULATORY_CARE_PROVIDER_SITE_OTHER): Payer: 59 | Admitting: Psychology

## 2020-04-02 ENCOUNTER — Ambulatory Visit: Payer: 59 | Admitting: Psychology

## 2020-04-02 DIAGNOSIS — F330 Major depressive disorder, recurrent, mild: Secondary | ICD-10-CM

## 2020-04-16 ENCOUNTER — Ambulatory Visit (INDEPENDENT_AMBULATORY_CARE_PROVIDER_SITE_OTHER): Payer: 59 | Admitting: Psychology

## 2020-04-16 DIAGNOSIS — F330 Major depressive disorder, recurrent, mild: Secondary | ICD-10-CM

## 2020-04-30 ENCOUNTER — Ambulatory Visit: Payer: 59 | Admitting: Psychology

## 2020-05-28 ENCOUNTER — Ambulatory Visit (INDEPENDENT_AMBULATORY_CARE_PROVIDER_SITE_OTHER): Payer: 59 | Admitting: Psychology

## 2020-05-28 ENCOUNTER — Ambulatory Visit: Payer: 59 | Admitting: Psychology

## 2020-05-28 DIAGNOSIS — F330 Major depressive disorder, recurrent, mild: Secondary | ICD-10-CM

## 2020-06-11 ENCOUNTER — Ambulatory Visit (INDEPENDENT_AMBULATORY_CARE_PROVIDER_SITE_OTHER): Payer: 59 | Admitting: Psychology

## 2020-06-11 DIAGNOSIS — F330 Major depressive disorder, recurrent, mild: Secondary | ICD-10-CM | POA: Diagnosis not present

## 2020-06-25 ENCOUNTER — Ambulatory Visit (INDEPENDENT_AMBULATORY_CARE_PROVIDER_SITE_OTHER): Payer: 59 | Admitting: Psychology

## 2020-06-25 DIAGNOSIS — F330 Major depressive disorder, recurrent, mild: Secondary | ICD-10-CM

## 2020-07-09 ENCOUNTER — Ambulatory Visit (INDEPENDENT_AMBULATORY_CARE_PROVIDER_SITE_OTHER): Payer: 59 | Admitting: Psychology

## 2020-07-09 DIAGNOSIS — F330 Major depressive disorder, recurrent, mild: Secondary | ICD-10-CM

## 2020-07-23 ENCOUNTER — Ambulatory Visit (INDEPENDENT_AMBULATORY_CARE_PROVIDER_SITE_OTHER): Payer: 59 | Admitting: Psychology

## 2020-07-23 DIAGNOSIS — F330 Major depressive disorder, recurrent, mild: Secondary | ICD-10-CM | POA: Diagnosis not present

## 2020-08-06 ENCOUNTER — Ambulatory Visit (INDEPENDENT_AMBULATORY_CARE_PROVIDER_SITE_OTHER): Payer: 59 | Admitting: Psychology

## 2020-08-06 DIAGNOSIS — F330 Major depressive disorder, recurrent, mild: Secondary | ICD-10-CM

## 2020-09-03 ENCOUNTER — Ambulatory Visit (INDEPENDENT_AMBULATORY_CARE_PROVIDER_SITE_OTHER): Payer: 59 | Admitting: Psychology

## 2020-09-03 DIAGNOSIS — F330 Major depressive disorder, recurrent, mild: Secondary | ICD-10-CM | POA: Diagnosis not present

## 2020-09-17 ENCOUNTER — Ambulatory Visit (INDEPENDENT_AMBULATORY_CARE_PROVIDER_SITE_OTHER): Payer: 59 | Admitting: Psychology

## 2020-09-17 DIAGNOSIS — F411 Generalized anxiety disorder: Secondary | ICD-10-CM

## 2020-10-01 ENCOUNTER — Ambulatory Visit (INDEPENDENT_AMBULATORY_CARE_PROVIDER_SITE_OTHER): Payer: 59 | Admitting: Psychology

## 2020-10-01 DIAGNOSIS — F330 Major depressive disorder, recurrent, mild: Secondary | ICD-10-CM | POA: Diagnosis not present

## 2020-10-15 ENCOUNTER — Ambulatory Visit (INDEPENDENT_AMBULATORY_CARE_PROVIDER_SITE_OTHER): Payer: 59 | Admitting: Psychology

## 2020-10-15 DIAGNOSIS — F330 Major depressive disorder, recurrent, mild: Secondary | ICD-10-CM | POA: Diagnosis not present

## 2020-10-29 ENCOUNTER — Ambulatory Visit: Payer: 59 | Admitting: Psychology

## 2020-11-12 ENCOUNTER — Ambulatory Visit (INDEPENDENT_AMBULATORY_CARE_PROVIDER_SITE_OTHER): Payer: 59 | Admitting: Psychology

## 2020-11-12 DIAGNOSIS — F419 Anxiety disorder, unspecified: Secondary | ICD-10-CM | POA: Diagnosis not present

## 2020-11-26 ENCOUNTER — Ambulatory Visit (INDEPENDENT_AMBULATORY_CARE_PROVIDER_SITE_OTHER): Payer: 59 | Admitting: Psychology

## 2020-11-26 DIAGNOSIS — F411 Generalized anxiety disorder: Secondary | ICD-10-CM | POA: Diagnosis not present

## 2020-12-10 ENCOUNTER — Ambulatory Visit: Payer: 59 | Admitting: Psychology

## 2020-12-11 ENCOUNTER — Ambulatory Visit: Payer: 59 | Admitting: Psychology

## 2020-12-24 ENCOUNTER — Ambulatory Visit (INDEPENDENT_AMBULATORY_CARE_PROVIDER_SITE_OTHER): Payer: 59 | Admitting: Psychology

## 2020-12-24 DIAGNOSIS — F411 Generalized anxiety disorder: Secondary | ICD-10-CM | POA: Diagnosis not present

## 2021-01-07 ENCOUNTER — Ambulatory Visit (INDEPENDENT_AMBULATORY_CARE_PROVIDER_SITE_OTHER): Payer: 59 | Admitting: Psychology

## 2021-01-07 DIAGNOSIS — F330 Major depressive disorder, recurrent, mild: Secondary | ICD-10-CM

## 2021-01-21 ENCOUNTER — Ambulatory Visit (INDEPENDENT_AMBULATORY_CARE_PROVIDER_SITE_OTHER): Payer: 59 | Admitting: Psychology

## 2021-01-21 DIAGNOSIS — F330 Major depressive disorder, recurrent, mild: Secondary | ICD-10-CM | POA: Diagnosis not present

## 2021-02-04 ENCOUNTER — Ambulatory Visit (INDEPENDENT_AMBULATORY_CARE_PROVIDER_SITE_OTHER): Payer: 59 | Admitting: Psychology

## 2021-02-04 DIAGNOSIS — F331 Major depressive disorder, recurrent, moderate: Secondary | ICD-10-CM

## 2021-02-18 ENCOUNTER — Ambulatory Visit (INDEPENDENT_AMBULATORY_CARE_PROVIDER_SITE_OTHER): Payer: 59 | Admitting: Psychology

## 2021-02-18 DIAGNOSIS — F330 Major depressive disorder, recurrent, mild: Secondary | ICD-10-CM

## 2021-03-04 ENCOUNTER — Ambulatory Visit (INDEPENDENT_AMBULATORY_CARE_PROVIDER_SITE_OTHER): Payer: 59 | Admitting: Psychology

## 2021-03-04 DIAGNOSIS — F419 Anxiety disorder, unspecified: Secondary | ICD-10-CM | POA: Diagnosis not present

## 2021-03-18 ENCOUNTER — Ambulatory Visit (INDEPENDENT_AMBULATORY_CARE_PROVIDER_SITE_OTHER): Payer: 59 | Admitting: Psychology

## 2021-03-18 DIAGNOSIS — F419 Anxiety disorder, unspecified: Secondary | ICD-10-CM

## 2021-04-01 ENCOUNTER — Ambulatory Visit (INDEPENDENT_AMBULATORY_CARE_PROVIDER_SITE_OTHER): Payer: 59 | Admitting: Psychology

## 2021-04-01 DIAGNOSIS — F419 Anxiety disorder, unspecified: Secondary | ICD-10-CM

## 2021-04-15 ENCOUNTER — Ambulatory Visit (INDEPENDENT_AMBULATORY_CARE_PROVIDER_SITE_OTHER): Payer: 59 | Admitting: Psychology

## 2021-04-15 DIAGNOSIS — F419 Anxiety disorder, unspecified: Secondary | ICD-10-CM

## 2021-04-29 ENCOUNTER — Ambulatory Visit (INDEPENDENT_AMBULATORY_CARE_PROVIDER_SITE_OTHER): Payer: 59 | Admitting: Psychology

## 2021-04-29 DIAGNOSIS — F411 Generalized anxiety disorder: Secondary | ICD-10-CM

## 2021-05-27 ENCOUNTER — Ambulatory Visit (INDEPENDENT_AMBULATORY_CARE_PROVIDER_SITE_OTHER): Payer: 59 | Admitting: Psychology

## 2021-05-27 DIAGNOSIS — F330 Major depressive disorder, recurrent, mild: Secondary | ICD-10-CM

## 2021-06-10 ENCOUNTER — Ambulatory Visit (INDEPENDENT_AMBULATORY_CARE_PROVIDER_SITE_OTHER): Payer: 59 | Admitting: Psychology

## 2021-06-10 DIAGNOSIS — F411 Generalized anxiety disorder: Secondary | ICD-10-CM | POA: Diagnosis not present

## 2021-06-24 ENCOUNTER — Ambulatory Visit: Payer: 59 | Admitting: Psychology

## 2021-06-27 ENCOUNTER — Ambulatory Visit (INDEPENDENT_AMBULATORY_CARE_PROVIDER_SITE_OTHER): Payer: 59 | Admitting: Psychology

## 2021-06-27 DIAGNOSIS — F411 Generalized anxiety disorder: Secondary | ICD-10-CM | POA: Diagnosis not present

## 2021-07-08 ENCOUNTER — Ambulatory Visit (INDEPENDENT_AMBULATORY_CARE_PROVIDER_SITE_OTHER): Payer: 59 | Admitting: Psychology

## 2021-07-08 DIAGNOSIS — F330 Major depressive disorder, recurrent, mild: Secondary | ICD-10-CM | POA: Diagnosis not present

## 2021-07-22 ENCOUNTER — Ambulatory Visit (INDEPENDENT_AMBULATORY_CARE_PROVIDER_SITE_OTHER): Payer: 59 | Admitting: Psychology

## 2021-07-22 DIAGNOSIS — F411 Generalized anxiety disorder: Secondary | ICD-10-CM

## 2021-08-05 ENCOUNTER — Ambulatory Visit (INDEPENDENT_AMBULATORY_CARE_PROVIDER_SITE_OTHER): Payer: 59 | Admitting: Psychology

## 2021-08-05 DIAGNOSIS — F411 Generalized anxiety disorder: Secondary | ICD-10-CM

## 2021-08-19 ENCOUNTER — Ambulatory Visit: Payer: 59 | Admitting: Psychology

## 2021-09-02 ENCOUNTER — Ambulatory Visit (INDEPENDENT_AMBULATORY_CARE_PROVIDER_SITE_OTHER): Payer: 59 | Admitting: Psychology

## 2021-09-02 DIAGNOSIS — F330 Major depressive disorder, recurrent, mild: Secondary | ICD-10-CM

## 2021-09-11 ENCOUNTER — Encounter: Payer: Self-pay | Admitting: Internal Medicine

## 2021-09-16 ENCOUNTER — Other Ambulatory Visit: Payer: Self-pay

## 2021-09-16 ENCOUNTER — Ambulatory Visit (AMBULATORY_SURGERY_CENTER): Payer: 59 | Admitting: *Deleted

## 2021-09-16 ENCOUNTER — Ambulatory Visit (INDEPENDENT_AMBULATORY_CARE_PROVIDER_SITE_OTHER): Payer: 59 | Admitting: Psychology

## 2021-09-16 VITALS — Ht 71.0 in | Wt 205.0 lb

## 2021-09-16 DIAGNOSIS — Z1211 Encounter for screening for malignant neoplasm of colon: Secondary | ICD-10-CM

## 2021-09-16 DIAGNOSIS — F330 Major depressive disorder, recurrent, mild: Secondary | ICD-10-CM

## 2021-09-16 NOTE — Progress Notes (Signed)
No egg or soy allergy known to patient  No issues known to pt with past sedation with any surgeries or procedures Patient denies ever being told they had issues or difficulty with intubation  No FH of Malignant Hyperthermia Pt is not on diet pills Pt is not on  home 02  Pt is not on blood thinners  Pt denies issues with constipation  No A fib or A flutter  Pt is fully vaccinated  for Covid    NO PA's for preps discussed with pt In PV today  Discussed with pt there will be an out-of-pocket cost for prep and that varies from $0 to 70 +  dollars - pt verbalized understanding   Due to the COVID-19 pandemic we are asking patients to follow certain guidelines in PV and the [facility]   Pt aware of COVID protocols and [facility] guidelines   PV completed over the phone. Pt verified name, DOB, address and insurance during PV today.  Pt mailed instruction packet with copy of consent form to read and not return, and instructions Pt encouraged to call with questions or issues.  If pt has My chart, procedure instructions sent via My Chart

## 2021-09-17 ENCOUNTER — Encounter: Payer: Self-pay | Admitting: Internal Medicine

## 2021-09-27 ENCOUNTER — Telehealth: Payer: Self-pay | Admitting: Internal Medicine

## 2021-09-27 NOTE — Telephone Encounter (Signed)
Good Afternoon Dr. Carlean Purl,  Patient called and canceled his appointment for 09/30/21 due to a scheduling conflict. He rescheduled for 12/19.

## 2021-09-27 NOTE — Telephone Encounter (Signed)
OK no charge ?

## 2021-09-30 ENCOUNTER — Ambulatory Visit (INDEPENDENT_AMBULATORY_CARE_PROVIDER_SITE_OTHER): Payer: 59 | Admitting: Psychology

## 2021-09-30 ENCOUNTER — Encounter: Payer: Self-pay | Admitting: Internal Medicine

## 2021-09-30 DIAGNOSIS — F330 Major depressive disorder, recurrent, mild: Secondary | ICD-10-CM

## 2021-10-10 HISTORY — PX: COLONOSCOPY W/ POLYPECTOMY: SHX1380

## 2021-10-14 ENCOUNTER — Ambulatory Visit (INDEPENDENT_AMBULATORY_CARE_PROVIDER_SITE_OTHER): Payer: 59 | Admitting: Psychology

## 2021-10-14 DIAGNOSIS — F330 Major depressive disorder, recurrent, mild: Secondary | ICD-10-CM

## 2021-10-28 ENCOUNTER — Ambulatory Visit (AMBULATORY_SURGERY_CENTER): Payer: 59 | Admitting: Internal Medicine

## 2021-10-28 ENCOUNTER — Encounter: Payer: Self-pay | Admitting: Internal Medicine

## 2021-10-28 ENCOUNTER — Ambulatory Visit: Payer: 59 | Admitting: Psychology

## 2021-10-28 VITALS — BP 141/94 | HR 81 | Temp 98.9°F | Resp 12 | Ht 71.0 in | Wt 205.0 lb

## 2021-10-28 DIAGNOSIS — D120 Benign neoplasm of cecum: Secondary | ICD-10-CM | POA: Diagnosis not present

## 2021-10-28 DIAGNOSIS — Z1211 Encounter for screening for malignant neoplasm of colon: Secondary | ICD-10-CM

## 2021-10-28 MED ORDER — SODIUM CHLORIDE 0.9 % IV SOLN: 500.0000 mL | Freq: Once | INTRAVENOUS | Status: DC

## 2021-10-28 NOTE — Progress Notes (Signed)
To pacu, VSS. Report to rn.tb °

## 2021-10-28 NOTE — Progress Notes (Signed)
[facility] History and Physical   Primary Care Physician:  Shon Baton, MD   Reason for Procedure:   Colon cancer screening  Plan:    colonoscopy     HPI: Joel Mayer is a 45 y.o. male here for colonoscopy.    Past Medical History:  Diagnosis Date   Anxiety    controlled   Depression    controlled   Hyperlipidemia    controlled    Past Surgical History:  Procedure Laterality Date   ELBOW SURGERY Right    TOE SURGERY Right    Rt big toe x 2   WISDOM TOOTH EXTRACTION      Prior to Admission medications   Medication Sig Start Date End Date Taking? Authorizing Provider  atorvastatin (LIPITOR) 20 MG tablet Take 20 mg by mouth daily. 07/23/21  Yes [provider]  buPROPion (WELLBUTRIN XL) 300 MG 24 hr tablet Take 300 mg by mouth daily. 07/23/21  Yes [provider]  FLUoxetine (PROZAC) 40 MG capsule Take 80 mg by mouth daily. 07/23/21  Yes [provider]  Multiple Vitamin (MULTIVITAMIN) tablet Take 1 tablet by mouth daily.   Yes [provider]  OVER THE COUNTER MEDICATION ? Name of herbal OTC vitamin   Yes [provider]  TURMERIC PO Take by mouth.   Yes [provider]    Current Outpatient Medications  Medication Sig Dispense Refill   atorvastatin (LIPITOR) 20 MG tablet Take 20 mg by mouth daily.     buPROPion (WELLBUTRIN XL) 300 MG 24 hr tablet Take 300 mg by mouth daily.     FLUoxetine (PROZAC) 40 MG capsule Take 80 mg by mouth daily.     Multiple Vitamin (MULTIVITAMIN) tablet Take 1 tablet by mouth daily.     OVER THE COUNTER MEDICATION ? Name of herbal OTC vitamin     TURMERIC PO Take by mouth.     Current Facility-Administered Medications  Medication Dose Route Frequency Provider Last Rate Last Admin   0.9 %  sodium chloride infusion  500 mL Intravenous Once Gatha Mayer, MD        Allergies as of 10/28/2021   (No Known Allergies)    Family History  Problem Relation Age of Onset    Colon cancer Neg Hx    Colon polyps Neg Hx    Esophageal cancer Neg Hx    Rectal cancer Neg Hx    Stomach cancer Neg Hx     Social History   Socioeconomic History   Marital status: Married    Spouse name: Not on file   Number of children: Not on file   Years of education: Not on file   Highest education level: Not on file  Occupational History   Not on file  Tobacco Use   Smoking status: Former    Types: Cigarettes    Quit date: 11/10/1997    Years since quitting: 23.9   Smokeless tobacco: Never  Substance and Sexual Activity   Alcohol use: Yes    Comment: Socially   Drug use: Never   Review of Systems:  All other review of systems negative except as mentioned in the HPI.  Physical Exam: Vital signs BP (!) 132/95    Pulse 78    Temp 98.9 F (37.2 C)    Ht 5\' 11"  (1.803 m)    Wt 205 lb (93 kg)    SpO2 98%    BMI 28.59 kg/m   General:   Alert,  Well-developed, well-nourished, pleasant and cooperative in NAD Lungs:  Clear throughout to auscultation.   Heart:  Regular rate and rhythm; no murmurs, clicks, rubs,  or gallops. Abdomen:  Soft, nontender and nondistended. Normal bowel sounds.   Neuro/Psych:  Alert and cooperative. Normal mood and affect. A and O x 3   @Joel Mayer  Simonne Maffucci, MD, [facility] [phone] (pager) 10/28/2021 1:30 PM@

## 2021-10-28 NOTE — Op Note (Signed)
Time Patient Name: Joel Mayer Procedure Date: 10/28/2021 1:00 PM MRN: [id number] Endoscopist: Gatha Mayer , MD Age: 45 Referring MD:  Date of Birth: 11-09-1976 Gender: Male Account #: [id number] Procedure:                Colonoscopy Indications:              Screening for colorectal malignant neoplasm Medicines:                Propofol per Anesthesia, Monitored Anesthesia Care Procedure:                Pre-Anesthesia Assessment:                           - Prior to the procedure, a History and Physical                            was performed, and patient medications and                            allergies were reviewed. The patient's tolerance of                            previous anesthesia was also reviewed. The risks                            and benefits of the procedure and the sedation                            options and risks were discussed with the patient.                            All questions were answered, and informed consent                            was obtained. Prior Anticoagulants: The patient has                            taken no previous anticoagulant or antiplatelet                            agents. ASA Grade Assessment: II - A patient with                            mild systemic disease. After reviewing the risks                            and benefits, the patient was deemed in                            satisfactory condition to undergo the procedure.                           After obtaining informed consent, the colonoscope  was passed under direct vision. Throughout the                            procedure, the patient's blood pressure, pulse, and                            oxygen saturations were monitored continuously. The                            Olympus PCF-H190DL (#[id number]) Colonoscope was                            introduced through the anus and advanced to the the                             cecum, identified by appendiceal orifice and                            ileocecal valve. The colonoscopy was performed                            without difficulty. The patient tolerated the                            procedure well. The quality of the bowel                            preparation was good. The ileocecal valve,                            appendiceal orifice, and rectum were photographed.                            The bowel preparation used was Miralax via split                            dose instruction. Scope In: 1:36:54 PM Scope Out: 1:52:08 PM Scope Withdrawal Time: 0 hours 11 minutes 53 seconds  Total Procedure Duration: 0 hours 15 minutes 14 seconds  Findings:                 The perianal and digital rectal examinations were                            normal. Pertinent negatives include normal prostate                            (size, shape, and consistency).                           A diminutive polyp was found in the cecum. The                            polyp was sessile. The polyp was removed with a  cold snare. Resection and retrieval were complete.                            Verification of patient identification for the                            specimen was done. Estimated blood loss was minimal.                           The exam was otherwise without abnormality on                            direct and retroflexion views. Complications:            No immediate complications. Estimated Blood Loss:     Estimated blood loss was minimal. Impression:               - One diminutive polyp in the cecum, removed with a                            cold snare. Resected and retrieved. Photo omitted.                           - The examination was otherwise normal on direct                            and retroflexion views. Recommendation:           - Patient has a contact number available for                            emergencies. The  signs and symptoms of potential                            delayed complications were discussed with the                            patient. Return to normal activities tomorrow.                            Written discharge instructions were provided to the                            patient.                           - Resume previous diet.                           - Continue present medications.                           - Await pathology results.                           - Repeat colonoscopy is recommended. The  colonoscopy date will be determined after pathology                            results from today's exam become available for                            review. Gatha Mayer, MD 10/28/2021 2:06:05 PM This report has been signed electronically.

## 2021-10-28 NOTE — Progress Notes (Signed)
Pt's states no medical or surgical changes since previsit or office visit. 

## 2021-10-28 NOTE — Patient Instructions (Addendum)
I found and removed one tiny colon polyp. I will let you know pathology results and when to have another routine colonoscopy by mail and/or My Chart.  I appreciate the opportunity to care for you. Gatha Mayer, MD, FACG   YOU HAD AN ENDOSCOPIC PROCEDURE TODAY AT [facility]:   Refer to the procedure report that was given to you for any specific questions about what was found during the examination.  If the procedure report does not answer your questions, please call your gastroenterologist to clarify.  If you requested that your care partner not be given the details of your procedure findings, then the procedure report has been included in a sealed envelope for you to review at your convenience later.  YOU SHOULD EXPECT: Some feelings of bloating in the abdomen. Passage of more gas than usual.  Walking can help get rid of the air that was put into your GI tract during the procedure and reduce the bloating. If you had a lower endoscopy (such as a colonoscopy or flexible sigmoidoscopy) you may notice spotting of blood in your stool or on the toilet paper. If you underwent a bowel prep for your procedure, you may not have a normal bowel movement for a few days.  Please Note:  You might notice some irritation and congestion in your nose or some drainage.  This is from the oxygen used during your procedure.  There is no need for concern and it should clear up in a day or so.  SYMPTOMS TO REPORT IMMEDIATELY:  Following lower endoscopy (colonoscopy or flexible sigmoidoscopy):  Excessive amounts of blood in the stool  Significant tenderness or worsening of abdominal pains  Swelling of the abdomen that is new, acute  Fever of 100F or higher  For urgent or emergent issues, a gastroenterologist can be reached at any hour by calling [phone]. Do not use MyChart messaging for urgent concerns.    DIET:  We do recommend a small meal at first, but then you may proceed to your  regular diet.  Drink plenty of fluids but you should avoid alcoholic beverages for 24 hours.  ACTIVITY:  You should plan to take it easy for the rest of today and you should NOT DRIVE or use heavy machinery until tomorrow (because of the sedation medicines used during the test).    FOLLOW UP: Our staff will call the number listed on your records 48-72 hours following your procedure to check on you and address any questions or concerns that you may have regarding the information given to you following your procedure. If we do not reach you, we will leave a message.  We will attempt to reach you two times.  During this call, we will ask if you have developed any symptoms of COVID 19. If you develop any symptoms (ie: fever, flu-like symptoms, shortness of breath, cough etc.) before then, please call [phone].  If you test positive for Covid 19 in the 2 weeks post procedure, please call and report this information to [place].    If any biopsies were taken you will be contacted by phone or by letter within the next 1-3 weeks.  Please call us at [phone] if you have not heard about the biopsies in 3 weeks.    SIGNATURES/CONFIDENTIALITY: You and/or your care partner have signed paperwork which will be entered into your electronic medical record.  These signatures attest to the fact that that the information above on your After Visit  Summary has been reviewed and is understood.  Full responsibility of the confidentiality of this discharge information lies with you and/or your care-partner.

## 2021-10-28 NOTE — Progress Notes (Signed)
[place] vital signs. 

## 2021-10-28 NOTE — Progress Notes (Signed)
Called to room to assist during endoscopic procedure.  Patient ID and intended procedure confirmed with present staff. Received instructions for my participation in the procedure from the performing physician.  

## 2021-10-30 ENCOUNTER — Telehealth: Payer: Self-pay | Admitting: *Deleted

## 2021-10-30 NOTE — Telephone Encounter (Signed)
°  Follow up Call-  Call back number 10/28/2021  Post procedure Call Back phone  # [phone]  Permission to leave phone message Yes  Some recent data might be hidden     Patient questions:  Do you have a fever, pain , or abdominal swelling? No. Pain Score  0 *  Have you tolerated food without any problems? Yes.    Have you been able to return to your normal activities? Yes.    Do you have any questions about your discharge instructions: Diet   No. Medications  No. Follow up visit  No.  Do you have questions or concerns about your Care? No.  Actions: * If pain score is 4 or above: No action needed, pain <4.  Have you developed a fever since your procedure? no  2.   Have you had an respiratory symptoms (SOB or cough) since your procedure? no  3.   Have you tested positive for COVID 19 since your procedure no  4.   Have you had any family members/close contacts diagnosed with the COVID 19 since your procedure?  no   If yes to any of these questions please route to Joylene Darrin, RN and Joella Prince, RN

## 2021-11-11 ENCOUNTER — Encounter: Payer: Self-pay | Admitting: Internal Medicine

## 2021-11-11 DIAGNOSIS — Z860101 Personal history of adenomatous and serrated colon polyps: Secondary | ICD-10-CM

## 2021-11-11 DIAGNOSIS — Z86010 Personal history of colonic polyps: Secondary | ICD-10-CM | POA: Insufficient documentation

## 2021-11-11 HISTORY — DX: Personal history of adenomatous and serrated colon polyps: Z86.0101

## 2021-11-11 HISTORY — DX: Personal history of colonic polyps: Z86.010

## 2021-11-25 ENCOUNTER — Ambulatory Visit: Payer: 59 | Admitting: Psychology

## 2021-12-09 ENCOUNTER — Ambulatory Visit (INDEPENDENT_AMBULATORY_CARE_PROVIDER_SITE_OTHER): Payer: 59 | Admitting: Psychology

## 2021-12-09 DIAGNOSIS — F411 Generalized anxiety disorder: Secondary | ICD-10-CM | POA: Diagnosis not present

## 2021-12-09 NOTE — Progress Notes (Signed)
Progress Note  Start: 12/09/2021 08:00 AM End: 12/09/2021 08:55 AM Diagnosis Z62.820 (Parent child relationship problem) [n/a]  Symptoms Avoidance of situations that require a degree of interpersonal contact. (Status: maintained) -- No Description Entered  Depressed or irritable mood. (Status: maintained) -- No Description Entered  Diminished interest in or enjoyment of activities. (Status: maintained) -- No Description Entered  Displays deficits in parenting knowledge and skills. (Status: maintained) -- No Description Entered  Expresses feelings of inadequacy in setting effective limits with their child. (Status: maintained) -- No Description Entered  History of chronic or recurrent depression for which the client has taken antidepressant medication, been hospitalized, had outpatient treatment, or had a course of electroconvulsive therapy. (Status: maintained) -- No Description Entered  Hypersensitivity to the criticism or disapproval of others. (Status: maintained) -- No Description Entered  Lack of energy. (Status: maintained) -- No Description Entered  Lacks knowledge regarding reasonable expectations for a child's behavior at a given developmental level. (Status: maintained) -- No Description Entered  New Symptom (Status: maintained) -- New Symptom Description  Poor concentration and indecisiveness. (Status: maintained) -- No Description Entered  Reluctant involvement in social situations out of fear of saying or doing something foolish or of becoming emotional in front of others. (Status: maintained) -- No Description Entered  Reports difficulty in managing the challenging problem behavior of their child. (Status: maintained) -- No Description Entered  Social withdrawal. (Status: maintained) -- No Description Entered  Medication Status compliance  Safety none  If Suicidal or Homicidal State Action Taken: unspecified  Current Risk: low Medications unspecified Objectives Related  Problem: Achieve a greater level of family connectedness. Description: Verbalize a sense of increased skill, effectiveness, and confidence in parenting. Target Date: 2022-04-22 Frequency: Biweekly Modality: individual Progress: 30%  Related Problem: Achieve a greater level of family connectedness. Description: Parents expand repertoire of parenting options. Target Date: 2022-04-22 Frequency: Biweekly Modality: individual Progress: 30%  Related Problem: Achieve a greater level of family connectedness. Description: Develop skills to talk openly and effectively with the children. Target Date: 2022-04-22 Frequency: Biweekly Modality: individual Progress: 20%  Related Problem: Achieve a greater level of family connectedness. Description: Freely express feelings of frustration, helplessness, and inadequacy that each experiences in the parenting role. Target Date: 2022-04-22 Frequency: Biweekly Modality: individual Progress: 40%  Related Problem: Achieve a greater level of family connectedness. Description: Identify unresolved childhood issues that affect parenting and work toward their resolution. Target Date: 2022-04-22 Frequency: Biweekly Modality: individual Progress: 40%  Related Problem: Achieve a greater level of family connectedness. Description: Learn and implement parenting practices that have demonstrated effectiveness. Target Date: 2022-04-22 Frequency: Biweekly Modality: individual Progress: 20%  Related Problem: Alleviate depressive symptoms and return to previous level of effective functioning. Description: Learn and implement conflict resolution skills to resolve interpersonal problems. Target Date: 2022-06-11 Frequency: Biweekly Modality: individual Progress: 10%  Related Problem: Alleviate depressive symptoms and return to previous level of effective functioning. Description: Implement mindfulness techniques for relapse prevention. Target Date:  2022-06-11 Frequency: Biweekly Modality: individual Progress: 10%  Related Problem: Alleviate depressive symptoms and return to previous level of effective functioning. Description: Learn and implement problem-solving and decision-making skills. Target Date: 2022-06-11 Frequency: Biweekly Modality: individual Progress: 20%  Related Problem: Develop healthy interpersonal relationships that lead to the alleviation and help prevent the relapse of depression. Description: Identify and replace thoughts and beliefs that support depression. Target Date: 2022-04-22 Frequency: Biweekly Modality: individual Progress: 50%  Related Problem: Develop healthy interpersonal relationships that lead to the alleviation  and help prevent the relapse of depression. Description: Verbalize insight into how past relationships may be influencing current experiences with depression. Target Date: 2022-04-22 Frequency: Biweekly Modality: individual Progress: 40%  Related Problem: Develop healthy interpersonal relationships that lead to the alleviation and help prevent the relapse of depression. Description: Verbalize an understanding and resolution of current interpersonal problems. Target Date: 2022-04-22 Frequency: Biweekly Modality: individual Progress: 40% Planned Intervention: For interpersonal deficits, help the client develop new interpersonal skills and relationships.  Related Problem: Develop healthy interpersonal relationships that lead to the alleviation and help prevent the relapse of depression. Description: Increasingly verbalize hopeful and positive statements regarding self, others, and the future. Target Date: 2022-04-22 Frequency: Biweekly Modality: individual Progress: 60%  Related Problem: Develop healthy interpersonal relationships that lead to the alleviation and help prevent the relapse of depression. Description: Identify important people in life, past and present, and describe  the quality, good and poor, of those relationships. Target Date: 2022-04-22 Frequency: Biweekly Modality: individual Progress: 60%  Related Problem: Develop healthy interpersonal relationships that lead to the alleviation and help prevent the relapse of depression. Description: Learn and implement behavioral strategies to overcome depression. Target Date: 2022-04-22 Frequency: Biweekly Modality: individual Progress: 60% Planned Intervention: Engage the client in "behavioral activation," increasing his/her activity level and contact with sources of reward, while identifying processes that inhibit activation (see Behavioral Activation for Depression by Beverly Gust, Dimidjian, and Herman-Dunn; or assign "Identify and Schedule Pleasant Activities" in the Adult Psychotherapy Homework Planner by [facility]); use behavioral techniques such as instruction, rehearsal, role-playing, role reversal, as needed, to facilitate activity in the client's daily life; reinforce success.  Planned Intervention: Assist the client in developing skills that increase the likelihood of deriving pleasure from behavioral activation (e.g., assertiveness skills, developing an exercise plan, less internal/more external focus, increased social involvement); reinforce success.  Related Problem: Reach a personal balance between solitary time and interpersonal interaction with others. Description: Verbalize and demonstrate an understanding and resolution of current interpersonal problems. Target Date: 2022-04-29 Frequency: Biweekly Modality: individual Progress: 0%  Related Problem: Reach a personal balance between solitary time and interpersonal interaction with others. Description: Identify, challenge, and replace biased, fearful self-talk with reality-based, positive self-talk. Target Date: 2021-04-29 Frequency: Biweekly Modality: individual Progress: 0%  Related Problem: Reach a personal balance between solitary time and  interpersonal interaction with others. Description: Learn and implement calming and coping strategies to manage anxiety symptoms during moments of social anxiety and lead to a more relaxed state in general. Target Date: 2022-04-29 Frequency: Biweekly Modality: individual Progress: 0%  Related Problem: Reach a personal balance between solitary time and interpersonal interaction with others. Description: Verbalize an accurate understanding of the vicious cycle of social anxiety and avoidance. Target Date: 2022-04-29 Frequency: Biweekly Modality: individual Progress: 0%  Related Problem: Reach a realistic view and approach to parenting, given the child's developmental level. Description: Verbalize an increased awareness and understanding of the unique issues and trials of parenting adolescents. Target Date: 2022-06-11 Frequency: Biweekly Modality: individual Progress: 20%  Related Problem: Reach a realistic view and approach to parenting, given the child's developmental level. Description: Parents and child report an increased feeling of connectedness between them. Target Date: 2022-06-11 Frequency: Biweekly Modality: individual Progress: 30%  Client Response full compliance  Service Location Location, [address] [place], [place] [address]  Service Code cpt [phone])  Facilitate problem solving  Identify automatic thoughts  Rationally challenge thoughts or beliefs/cognitive restructuring  Normalize/Reframe  Validate/empathize  Identify/label emotions  Made  connections between past and present Laurent [employer] and I met today in remote video (WebEx) individual face to face therapy as an accommodation during the COVID-19 pandemic.   Distance Site: Client Home  Originating Site: Dr. Ronita Hipps Remote Office  Consent: Obtained verbal consent to transmit session remotely    [name] reports that his parents came for a visit this past weekend.  Their visit stirred up a lot  of angry feelings and resentment about how he was raised as a child.  We d/e/p why he felt so reactive to his mother's positive comment about his parenting.  We made connections between the past and present.  [name] agreed to continue to reflect on his reactions to his mother and will journal between sessions.   ________________________________ Attachment Style: Secure = 12 Anxious = 8 Avoidant = 5 ________________________________  Home Practice:    Progress: Patient demonstrates improved anxiety particularly around parenting issues. By his own report he continues to have longer periods of better mood states and a decrease in frequency of obsessive thoughts. He is showing progress in becoming more positively engaged with his family. ________________________________   [name], PhD

## 2021-12-23 ENCOUNTER — Ambulatory Visit (INDEPENDENT_AMBULATORY_CARE_PROVIDER_SITE_OTHER): Payer: 59 | Admitting: Psychology

## 2021-12-23 DIAGNOSIS — F411 Generalized anxiety disorder: Secondary | ICD-10-CM

## 2021-12-23 NOTE — Progress Notes (Signed)
Progress Note  Start: 12/23/2021 08:00 AM End: 12/23/2021 08:55 AM Diagnosis Z62.820 (Parent child relationship problem) [n/a]  Symptoms Avoidance of situations that require a degree of interpersonal contact. (Status: maintained) -- No Description Entered  Depressed or irritable mood. (Status: maintained) -- No Description Entered  Diminished interest in or enjoyment of activities. (Status: maintained) -- No Description Entered  Displays deficits in parenting knowledge and skills. (Status: maintained) -- No Description Entered  Expresses feelings of inadequacy in setting effective limits with their child. (Status: maintained) -- No Description Entered  History of chronic or recurrent depression for which the client has taken antidepressant medication, been hospitalized, had outpatient treatment, or had a course of electroconvulsive therapy. (Status: maintained) -- No Description Entered  Hypersensitivity to the criticism or disapproval of others. (Status: maintained) -- No Description Entered  Lack of energy. (Status: maintained) -- No Description Entered  Lacks knowledge regarding reasonable expectations for a child's behavior at a given developmental level. (Status: maintained) -- No Description Entered  New Symptom (Status: maintained) -- New Symptom Description  Poor concentration and indecisiveness. (Status: maintained) -- No Description Entered  Reluctant involvement in social situations out of fear of saying or doing something foolish or of becoming emotional in front of others. (Status: maintained) -- No Description Entered  Mayer difficulty in managing the challenging problem behavior of their child. (Status: maintained) -- No Description Entered  Social withdrawal. (Status: maintained) -- No Description Entered  Medication Status compliance  Safety none  If Suicidal or Homicidal State Action Taken: unspecified  Current Risk: low Medications unspecified Objectives Related  Problem: Achieve a greater level of family connectedness. Description: Verbalize a sense of increased skill, effectiveness, and confidence in parenting. Target Date: 2022-04-22 Frequency: Biweekly Modality: individual Progress: 30%  Related Problem: Achieve a greater level of family connectedness. Description: Parents expand repertoire of parenting options. Target Date: 2022-04-22 Frequency: Biweekly Modality: individual Progress: 30%  Related Problem: Achieve a greater level of family connectedness. Description: Develop skills to talk openly and effectively with the children. Target Date: 2022-04-22 Frequency: Biweekly Modality: individual Progress: 20%  Related Problem: Achieve a greater level of family connectedness. Description: Freely express feelings of frustration, helplessness, and inadequacy that each experiences in the parenting role. Target Date: 2022-04-22 Frequency: Biweekly Modality: individual Progress: 40%  Related Problem: Achieve a greater level of family connectedness. Description: Identify unresolved childhood issues that affect parenting and work toward their resolution. Target Date: 2022-04-22 Frequency: Biweekly Modality: individual Progress: 40%  Related Problem: Achieve a greater level of family connectedness. Description: Learn and implement parenting practices that have demonstrated effectiveness. Target Date: 2022-04-22 Frequency: Biweekly Modality: individual Progress: 20%  Related Problem: Alleviate depressive symptoms and return to previous level of effective functioning. Description: Learn and implement conflict resolution skills to resolve interpersonal problems. Target Date: 2022-06-11 Frequency: Biweekly Modality: individual Progress: 10%  Related Problem: Alleviate depressive symptoms and return to previous level of effective functioning. Description: Implement mindfulness techniques for relapse prevention. Target Date:  2022-06-11 Frequency: Biweekly Modality: individual Progress: 10%  Related Problem: Alleviate depressive symptoms and return to previous level of effective functioning. Description: Learn and implement problem-solving and decision-making skills. Target Date: 2022-06-11 Frequency: Biweekly Modality: individual Progress: 20%  Related Problem: Develop healthy interpersonal relationships that lead to the alleviation and help prevent the relapse of depression. Description: Identify and replace thoughts and beliefs that support depression. Target Date: 2022-04-22 Frequency: Biweekly Modality: individual Progress: 50%  Related Problem: Develop healthy interpersonal relationships that lead to the alleviation  and help prevent the relapse of depression. Description: Verbalize insight into how past relationships may be influencing current experiences with depression. Target Date: 2022-04-22 Frequency: Biweekly Modality: individual Progress: 40%  Related Problem: Develop healthy interpersonal relationships that lead to the alleviation and help prevent the relapse of depression. Description: Verbalize an understanding and resolution of current interpersonal problems. Target Date: 2022-04-22 Frequency: Biweekly Modality: individual Progress: 40% Planned Intervention: For interpersonal deficits, help the client develop new interpersonal skills and relationships.  Related Problem: Develop healthy interpersonal relationships that lead to the alleviation and help prevent the relapse of depression. Description: Increasingly verbalize hopeful and positive statements regarding self, others, and the future. Target Date: 2022-04-22 Frequency: Biweekly Modality: individual Progress: 60%  Related Problem: Develop healthy interpersonal relationships that lead to the alleviation and help prevent the relapse of depression. Description: Identify important people in life, past and present, and describe  the quality, good and poor, of those relationships. Target Date: 2022-04-22 Frequency: Biweekly Modality: individual Progress: 60%  Related Problem: Develop healthy interpersonal relationships that lead to the alleviation and help prevent the relapse of depression. Description: Learn and implement behavioral strategies to overcome depression. Target Date: 2022-04-22 Frequency: Biweekly Modality: individual Progress: 60% Planned Intervention: Engage the client in "behavioral activation," increasing his/her activity level and contact with sources of reward, while identifying processes that inhibit activation (see Behavioral Activation for Depression by Beverly Gust, Dimidjian, and Herman-Dunn; or assign "Identify and Schedule Pleasant Activities" in the Adult Psychotherapy Homework Planner by [facility]); use behavioral techniques such as instruction, rehearsal, role-playing, role reversal, as needed, to facilitate activity in the client's daily life; reinforce success.  Planned Intervention: Assist the client in developing skills that increase the likelihood of deriving pleasure from behavioral activation (e.g., assertiveness skills, developing an exercise plan, less internal/more external focus, increased social involvement); reinforce success.  Related Problem: Reach a personal balance between solitary time and interpersonal interaction with others. Description: Verbalize and demonstrate an understanding and resolution of current interpersonal problems. Target Date: 2022-04-29 Frequency: Biweekly Modality: individual Progress: 0%  Related Problem: Reach a personal balance between solitary time and interpersonal interaction with others. Description: Identify, challenge, and replace biased, fearful self-talk with reality-based, positive self-talk. Target Date: 2021-04-29 Frequency: Biweekly Modality: individual Progress: 0%  Related Problem: Reach a personal balance between solitary time and  interpersonal interaction with others. Description: Learn and implement calming and coping strategies to manage anxiety symptoms during moments of social anxiety and lead to a more relaxed state in general. Target Date: 2022-04-29 Frequency: Biweekly Modality: individual Progress: 0%  Related Problem: Reach a personal balance between solitary time and interpersonal interaction with others. Description: Verbalize an accurate understanding of the vicious cycle of social anxiety and avoidance. Target Date: 2022-04-29 Frequency: Biweekly Modality: individual Progress: 0%  Related Problem: Reach a realistic view and approach to parenting, given the child's developmental level. Description: Verbalize an increased awareness and understanding of the unique issues and trials of parenting adolescents. Target Date: 2022-06-11 Frequency: Biweekly Modality: individual Progress: 20%  Related Problem: Reach a realistic view and approach to parenting, given the child's developmental level. Description: Parents and child report an increased feeling of connectedness between them. Target Date: 2022-06-11 Frequency: Biweekly Modality: individual Progress: 30%  Client Response full compliance  Service Location Location, [address] [place], [place] [address]  Service Code cpt [phone])  Facilitate problem solving  Identify automatic thoughts  Rationally challenge thoughts or beliefs/cognitive restructuring  Normalize/Reframe  Validate/empathize  Identify/label emotions  Made  connections between past and present Joel Mayer and I met today in remote video (WebEx) individual face to face therapy as an accommodation during the COVID-19 pandemic.   Distance Site: Client Home  Originating Site: Dr. Ronita Hipps Remote Office  Consent: Obtained verbal consent to transmit session remotely    Joel Mayer that it was a very hectic weekend with the girls involvement in basketball and  cheering.  He shared some of the difficulties he has as a parent in dealing with sports parents, different values and the girls social problems.    Joel Mayer states that he has made some progress with adding some exercise to his day.  He states that he is already noticing some positive changes.  We d/e ways to be more intentional in the pursuit of his goals even though this may be a difficult thing to do.    ________________________________ Attachment Style: Secure = 12 Anxious = 8 Avoidant = 5 ________________________________  Home Practice:    Progress: Patient demonstrates improved anxiety particularly around parenting issues. By his own report he continues to have longer periods of better mood states and a decrease in frequency of obsessive thoughts. He is showing progress in becoming more positively engaged with his family. ________________________________   Joel Crochet, PhD

## 2022-01-06 ENCOUNTER — Ambulatory Visit (INDEPENDENT_AMBULATORY_CARE_PROVIDER_SITE_OTHER): Payer: 59 | Admitting: Psychology

## 2022-01-06 DIAGNOSIS — F320 Major depressive disorder, single episode, mild: Secondary | ICD-10-CM | POA: Diagnosis not present

## 2022-01-06 NOTE — Progress Notes (Signed)
Progress Note  Start: 01/06/2022 08:00 AM End: 01/06/2022 08:55 AM Diagnosis Z62.820 (Parent child relationship problem) [n/a]  Symptoms Avoidance of situations that require a degree of interpersonal contact. (Status: maintained) -- No Description Entered  Depressed or irritable mood. (Status: maintained) -- No Description Entered  Diminished interest in or enjoyment of activities. (Status: maintained) -- No Description Entered  Displays deficits in parenting knowledge and skills. (Status: maintained) -- No Description Entered  Expresses feelings of inadequacy in setting effective limits with their child. (Status: maintained) -- No Description Entered  History of chronic or recurrent depression for which the client has taken antidepressant medication, been hospitalized, had outpatient treatment, or had a course of electroconvulsive therapy. (Status: maintained) -- No Description Entered  Hypersensitivity to the criticism or disapproval of others. (Status: maintained) -- No Description Entered  Lack of energy. (Status: maintained) -- No Description Entered  Lacks knowledge regarding reasonable expectations for a child's behavior at a given developmental level. (Status: maintained) -- No Description Entered  New Symptom (Status: maintained) -- New Symptom Description  Poor concentration and indecisiveness. (Status: maintained) -- No Description Entered  Reluctant involvement in social situations out of fear of saying or doing something foolish or of becoming emotional in front of others. (Status: maintained) -- No Description Entered  Reports difficulty in managing the challenging problem behavior of their child. (Status: maintained) -- No Description Entered  Social withdrawal. (Status: maintained) -- No Description Entered  Medication Status compliance  Safety none  If Suicidal or Homicidal State Action Taken: unspecified  Current Risk: low Medications unspecified Objectives Related  Problem: Achieve a greater level of family connectedness. Description: Verbalize a sense of increased skill, effectiveness, and confidence in parenting. Target Date: 2022-04-22 Frequency: Biweekly Modality: individual Progress: 30%  Related Problem: Achieve a greater level of family connectedness. Description: Parents expand repertoire of parenting options. Target Date: 2022-04-22 Frequency: Biweekly Modality: individual Progress: 30%  Related Problem: Achieve a greater level of family connectedness. Description: Develop skills to talk openly and effectively with the children. Target Date: 2022-04-22 Frequency: Biweekly Modality: individual Progress: 20%  Related Problem: Achieve a greater level of family connectedness. Description: Freely express feelings of frustration, helplessness, and inadequacy that each experiences in the parenting role. Target Date: 2022-04-22 Frequency: Biweekly Modality: individual Progress: 40%  Related Problem: Achieve a greater level of family connectedness. Description: Identify unresolved childhood issues that affect parenting and work toward their resolution. Target Date: 2022-04-22 Frequency: Biweekly Modality: individual Progress: 40%  Related Problem: Achieve a greater level of family connectedness. Description: Learn and implement parenting practices that have demonstrated effectiveness. Target Date: 2022-04-22 Frequency: Biweekly Modality: individual Progress: 20%  Related Problem: Alleviate depressive symptoms and return to previous level of effective functioning. Description: Learn and implement conflict resolution skills to resolve interpersonal problems. Target Date: 2022-06-11 Frequency: Biweekly Modality: individual Progress: 10%  Related Problem: Alleviate depressive symptoms and return to previous level of effective functioning. Description: Implement mindfulness techniques for relapse prevention. Target Date:  2022-06-11 Frequency: Biweekly Modality: individual Progress: 10%  Related Problem: Alleviate depressive symptoms and return to previous level of effective functioning. Description: Learn and implement problem-solving and decision-making skills. Target Date: 2022-06-11 Frequency: Biweekly Modality: individual Progress: 20%  Related Problem: Develop healthy interpersonal relationships that lead to the alleviation and help prevent the relapse of depression. Description: Identify and replace thoughts and beliefs that support depression. Target Date: 2022-04-22 Frequency: Biweekly Modality: individual Progress: 50%  Related Problem: Develop healthy interpersonal relationships that lead to the alleviation  and help prevent the relapse of depression. Description: Verbalize insight into how past relationships may be influencing current experiences with depression. Target Date: 2022-04-22 Frequency: Biweekly Modality: individual Progress: 40%  Related Problem: Develop healthy interpersonal relationships that lead to the alleviation and help prevent the relapse of depression. Description: Verbalize an understanding and resolution of current interpersonal problems. Target Date: 2022-04-22 Frequency: Biweekly Modality: individual Progress: 40% Planned Intervention: For interpersonal deficits, help the client develop new interpersonal skills and relationships.  Related Problem: Develop healthy interpersonal relationships that lead to the alleviation and help prevent the relapse of depression. Description: Increasingly verbalize hopeful and positive statements regarding self, others, and the future. Target Date: 2022-04-22 Frequency: Biweekly Modality: individual Progress: 60%  Related Problem: Develop healthy interpersonal relationships that lead to the alleviation and help prevent the relapse of depression. Description: Identify important people in life, past and present, and describe  the quality, good and poor, of those relationships. Target Date: 2022-04-22 Frequency: Biweekly Modality: individual Progress: 60%  Related Problem: Develop healthy interpersonal relationships that lead to the alleviation and help prevent the relapse of depression. Description: Learn and implement behavioral strategies to overcome depression. Target Date: 2022-04-22 Frequency: Biweekly Modality: individual Progress: 60% Planned Intervention: Engage the client in "behavioral activation," increasing his/her activity level and contact with sources of reward, while identifying processes that inhibit activation (see Behavioral Activation for Depression by Beverly Gust, Dimidjian, and Herman-Dunn; or assign "Identify and Schedule Pleasant Activities" in the Adult Psychotherapy Homework Planner by [facility]); use behavioral techniques such as instruction, rehearsal, role-playing, role reversal, as needed, to facilitate activity in the client's daily life; reinforce success.  Planned Intervention: Assist the client in developing skills that increase the likelihood of deriving pleasure from behavioral activation (e.g., assertiveness skills, developing an exercise plan, less internal/more external focus, increased social involvement); reinforce success.  Related Problem: Reach a personal balance between solitary time and interpersonal interaction with others. Description: Verbalize and demonstrate an understanding and resolution of current interpersonal problems. Target Date: 2022-04-29 Frequency: Biweekly Modality: individual Progress: 0%  Related Problem: Reach a personal balance between solitary time and interpersonal interaction with others. Description: Identify, challenge, and replace biased, fearful self-talk with reality-based, positive self-talk. Target Date: 2021-04-29 Frequency: Biweekly Modality: individual Progress: 0%  Related Problem: Reach a personal balance between solitary time and  interpersonal interaction with others. Description: Learn and implement calming and coping strategies to manage anxiety symptoms during moments of social anxiety and lead to a more relaxed state in general. Target Date: 2022-04-29 Frequency: Biweekly Modality: individual Progress: 0%  Related Problem: Reach a personal balance between solitary time and interpersonal interaction with others. Description: Verbalize an accurate understanding of the vicious cycle of social anxiety and avoidance. Target Date: 2022-04-29 Frequency: Biweekly Modality: individual Progress: 0%  Related Problem: Reach a realistic view and approach to parenting, given the child's developmental level. Description: Verbalize an increased awareness and understanding of the unique issues and trials of parenting adolescents. Target Date: 2022-06-11 Frequency: Biweekly Modality: individual Progress: 20%  Related Problem: Reach a realistic view and approach to parenting, given the child's developmental level. Description: Parents and child report an increased feeling of connectedness between them. Target Date: 2022-06-11 Frequency: Biweekly Modality: individual Progress: 30%  Client Response full compliance  Service Location Location, [address] [place], [place] [address]  Service Code cpt [phone])  Facilitate problem solving  Identify automatic thoughts  Rationally challenge thoughts or beliefs/cognitive restructuring  Normalize/Reframe  Validate/empathize  Identify/label emotions  Made  connections between past and present Photo(Light) Therapy Habit Control  Derrich "[employer] and I met today in remote video (WebEx) individual face to face therapy as an accommodation during the COVID-19 pandemic.   Distance Site: Client Home  Originating Site: Dr. Ronita Hipps Remote Office  Consent: Obtained verbal consent to transmit session remotely    [name] reports that he needs to learn how "to let people  be who they are."  I made some inquires about events that led to this insight.  We d/e/p his thoughts, beliefs and actions.  He left the session with a better idea of how to allow the natural consequences play out in different situations.  [name] admitted that the gloomy weather left him feeling very negative and he didn't like it.  I asked if he was using his light therapy lamp and he said he wasn't.  We reviewed what he needed to do and how to create a space and time for his light therapy.   ________________________________ Attachment Style: Secure = 12 Anxious = 8 Avoidant = 5 ________________________________  Home Practice:    Progress: Patient demonstrates improved anxiety particularly around parenting issues. By his own report he continues to have longer periods of better mood states and a decrease in frequency of obsessive thoughts. He is showing progress in becoming more positively engaged with his family. ________________________________   [name], PhD

## 2022-01-20 ENCOUNTER — Ambulatory Visit (INDEPENDENT_AMBULATORY_CARE_PROVIDER_SITE_OTHER): Payer: 59 | Admitting: Psychology

## 2022-01-20 DIAGNOSIS — F330 Major depressive disorder, recurrent, mild: Secondary | ICD-10-CM | POA: Diagnosis not present

## 2022-01-20 NOTE — Progress Notes (Signed)
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 (Status: maintained) -- No Description Entered  Depressed or irritable mood. (Status: maintained) -- No Description Entered  Diminished interest in or enjoyment of activities. (Status: maintained) -- No Description Entered  Displays deficits in parenting knowledge and skills. (Status: maintained) -- No Description Entered  Expresses feelings of inadequacy in setting effective limits with their child. (Status: maintained) -- No Description Entered  History of chronic or recurrent depression for which the client has taken antidepressant medication, been hospitalized, had outpatient treatment, or had a course of electroconvulsive therapy. (Status: maintained) -- No Description Entered  Hypersensitivity to the criticism or disapproval of others. (Status: maintained) -- No Description Entered  Lack of energy. (Status: maintained) -- No Description Entered  Lacks knowledge regarding reasonable expectations for a child's behavior at a given developmental level. (Status: maintained) -- No Description Entered  New Symptom (Status: maintained) -- New Symptom Description  Poor concentration and indecisiveness. (Status: maintained) -- No Description Entered  Reluctant involvement in social situations out of fear of saying or doing something foolish or of becoming emotional in front of others. (Status: maintained) -- No Description Entered  Reports difficulty in managing the challenging problem behavior of their child. (Status: maintained) -- No Description  Entered  Social withdrawal. (Status: maintained) -- No Description Entered  Medication Status compliance  Safety none  If Suicidal or Homicidal State Action Taken: unspecified  Current Risk: low Medications unspecified Objectives Related Problem: Achieve a greater level of family connectedness. Description: Verbalize a sense of increased skill, effectiveness, and confidence in parenting. Target Date: 2022-04-29 Frequency: Biweekly Modality: individual Progress: 30%  Related Problem: Achieve a greater level of family connectedness. Description: Parents expand repertoire of parenting options. Target Date: 2022-04-29 Frequency: Biweekly Modality: individual Progress: 30%  Related Problem: Achieve a greater level of family connectedness. Description: Develop skills to talk openly and effectively with the children. Target Date: 2022-04-29 Frequency: Biweekly Modality: individual Progress: 20%  Related Problem: Achieve a greater level of family connectedness. Description: Freely express feelings of frustration, helplessness, and inadequacy that each experiences in the parenting role. Target Date: 2022-04-29 Frequency: Biweekly Modality: individual Progress: 40%  Related Problem: Achieve a greater level of family connectedness. Description: Identify unresolved childhood issues that affect parenting and work toward their resolution. Target Date: 2022-04-29 Frequency: Biweekly Modality: individual Progress: 40%  Related Problem: Achieve a greater level of family connectedness. Description: Learn and implement parenting practices that have demonstrated effectiveness. Target Date: 2022-04-29 Frequency: Biweekly Modality: individual Progress: 20%  Related Problem: Alleviate depressive symptoms and return to previous level of effective functioning. Description: Learn and implement conflict resolution skills to resolve interpersonal problems. Target Date:  2022-04-29 Frequency: Biweekly Modality: individual Progress: 10%  Related Problem: Alleviate depressive symptoms and return to previous level of effective functioning. Description: Implement mindfulness techniques for relapse prevention. Target Date: 2022-04-29 Frequency: Biweekly Modality: individual Progress: 10%  Related Problem: Alleviate depressive symptoms and return to previous level of effective functioning. Description: Learn and implement problem-solving and decision-making skills. Target Date: 2022-04-29 Frequency: Biweekly Modality: individual Progress: 20%  Related Problem: Develop healthy interpersonal relationships that lead to the  alleviation and help prevent the relapse of depression. Description: Identify and replace thoughts and beliefs that support depression. Target Date: 2022-04-29 Frequency: Biweekly Modality: individual Progress: 50%  Related Problem: Develop healthy interpersonal relationships that lead to the alleviation and help prevent the relapse of depression. Description: Verbalize insight into how past relationships may be influencing current experiences with depression. Target Date: 2022-04-29 Frequency: Biweekly Modality: individual Progress: 40%  Related Problem: Develop healthy interpersonal relationships that lead to the alleviation and help prevent the relapse of depression. Description: Verbalize an understanding and resolution of current interpersonal problems. Target Date: 2022-04-29 Frequency: Biweekly Modality: individual Progress: 40% Planned Intervention: For interpersonal deficits, help the client develop new interpersonal skills and relationships.  Related Problem: Develop healthy interpersonal relationships that lead to the alleviation and help prevent the relapse of depression. Description: Increasingly verbalize hopeful and positive statements regarding self, others, and the future. Target Date: 2022-04-29 Frequency:  Biweekly Modality: individual Progress: 60%  Related Problem: Develop healthy interpersonal relationships that lead to the alleviation and help prevent the relapse of depression. Description: Identify important people in life, past and present, and describe the quality, good and poor, of those relationships. Target Date: 2022-04-29 Frequency: Biweekly Modality: individual Progress: 60%  Related Problem: Develop healthy interpersonal relationships that lead to the alleviation and help prevent the relapse of depression. Description: Learn and implement behavioral strategies to overcome depression. Target Date: 2022-04-29 Frequency: Biweekly Modality: individual Progress: 60% Planned Intervention: Engage the client in "behavioral activation," increasing his/her activity level and contact with sources of reward, while identifying processes that inhibit activation (see Behavioral Activation for Depression by Beverly Gust, Dimidjian, and Herman-Dunn; or assign "Identify and Schedule Pleasant Activities" in the Adult Psychotherapy Homework Planner by [facility]); use behavioral techniques such as instruction, rehearsal, role-playing, role reversal, as needed, to facilitate activity in the client's daily life; reinforce success.  Planned Intervention: Assist the client in developing skills that increase the likelihood of deriving pleasure from behavioral activation (e.g., assertiveness skills, developing an exercise plan, less internal/more external focus, increased social involvement); reinforce success.  Related Problem: Reach a personal balance between solitary time and interpersonal interaction with others. Description: Verbalize and demonstrate an understanding and resolution of current interpersonal problems. Target Date: 2022-04-29 Frequency: Biweekly Modality: individual Progress: 0%  Related Problem: Reach a personal balance between solitary time and interpersonal interaction with  others. Description: Identify, challenge, and replace biased, fearful self-talk with reality-based, positive self-talk. Target Date: 2021-04-29 Frequency: Biweekly Modality: individual Progress: 0%  Related Problem: Reach a personal balance between solitary time and interpersonal interaction with others. Description: Learn and implement calming and coping strategies to manage anxiety symptoms during moments of social anxiety and lead to a more relaxed state in general. Target Date: 2022-04-29 Frequency: Biweekly Modality: individual Progress: 0%  Related Problem: Reach a personal balance between solitary time and interpersonal interaction with others. Description: Verbalize an accurate understanding of the vicious cycle of social anxiety and avoidance. Target Date: 2022-04-29 Frequency: Biweekly Modality: individual Progress: 0%  Related Problem: Reach a realistic view and approach to parenting, given the child's developmental level. Description: Verbalize an increased awareness and understanding of the unique issues and trials of parenting adolescents. Target Date: 2022-04-29 Frequency: Biweekly Modality: individual Progress: 20%  Related Problem: Reach a realistic view and approach to parenting, given the child's developmental level. Description: Parents and child report an increased feeling of connectedness between them. Target Date: 2022-04-29 Frequency: Biweekly Modality: individual Progress: 30%  Client Response full compliance  Service Location  Location, [address] [place], [place] [address]  Service Code cpt [phone] [id number])  Facilitate problem solving  Identify automatic thoughts  Rationally challenge thoughts or beliefs/cognitive restructuring  Normalize/Reframe  Validate/empathize  Identify/label emotions  Made connections between past and present Photo(Light) Therapy Habit Control   Lathyn [employer] and I met today in remote video (WebEx) individual  face to face therapy as an accommodation during the COVID-19 pandemic.   Distance Site: Client Home  Originating Site: Dr. Ronita Hipps Remote Office  Consent: Obtained verbal consent to transmit session remotely    [name] reports that he has continued to work on his morning routine.  He was successful half the time (7 out of 14 days).  He continues to c/o that he is focused on the negative and needs to find more positive things to read.  I gently challenged him to act instead of just read.  I did some motivational interviewing. We identified that he valued social justice and wants his daughters to think this is important.  We d/ ways that the need to start with some small steps.  He agreed to journal on what small steps he might start with.    ________________________________ Attachment Style: Secure = 12 Anxious = 8 Avoidant = 5 ________________________________  Home Practice:    Progress: Patient demonstrates improved anxiety particularly around parenting issues. By his own report he continues to have longer periods of better mood states and a decrease in frequency of obsessive thoughts. He is showing progress in becoming more positively engaged with his family. ________________________________   [name], PhD

## 2022-02-03 ENCOUNTER — Ambulatory Visit: Payer: 59 | Admitting: Psychology

## 2022-02-03 ENCOUNTER — Ambulatory Visit (INDEPENDENT_AMBULATORY_CARE_PROVIDER_SITE_OTHER): Payer: 59 | Admitting: Psychology

## 2022-02-03 DIAGNOSIS — F320 Major depressive disorder, single episode, mild: Secondary | ICD-10-CM

## 2022-02-03 NOTE — Progress Notes (Signed)
[place] ? ?PROGRESS NOTE: ? ?Name: Joel Mayer ?MRN: [id number] ?DOB: 09/30/1976 ?PCP: Shon Baton, MD ? ?Date: 02/03/2022 ?Start: 08:00 AM ?End:  08:55 AM ? 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 (Status: maintained) -- No Description Entered  ?Depressed or irritable mood. (Status: maintained) -- No Description Entered  ?Diminished interest in or enjoyment of activities. (Status: maintained) -- No Description Entered  ?Displays deficits in parenting knowledge and skills. (Status: maintained) -- No Description Entered  ?Expresses feelings of inadequacy in setting effective limits with their child. (Status: maintained) -- No Description Entered  ?History of chronic or recurrent depression for which the client has taken antidepressant medication, been hospitalized, had outpatient treatment, or had a course of electroconvulsive therapy. (Status: maintained) -- No Description Entered  ?Hypersensitivity to the criticism or disapproval of others. (Status: maintained) -- No Description Entered  ?Lack of energy. (Status: maintained) -- No Description Entered  ?Lacks knowledge regarding reasonable expectations for a child's behavior at a given developmental level. (Status: maintained) -- No Description Entered  ?New Symptom (Status: maintained) -- New Symptom Description  ?Poor concentration and indecisiveness. (Status: maintained) -- No Description Entered  ?Reluctant involvement in social situations out of fear of saying or doing something foolish or of becoming emotional in front of others. (Status: maintained) -- No Description Entered  ?Reports difficulty in managing the challenging problem behavior of their child. (Status: maintained) -- No Description Entered  ?Social withdrawal. (Status: maintained) -- No Description Entered   ?Medication Status ?compliance  ?Safety ?none  ?If Suicidal or Homicidal State Action Taken: unspecified  ?Current Risk: low ?Medications ?unspecified ?Objectives ?Related Problem: Achieve a greater level of family connectedness. ?Description: Verbalize a sense of increased skill, effectiveness, and confidence in parenting. ?Target Date: 2022-04-29 ?Frequency: Biweekly ?Modality: individual ?Progress: 30% ? ?Related Problem: Achieve a greater level of family connectedness. ?Description: Parents expand repertoire of parenting options. ?Target Date: 2022-04-29 ?Frequency: Biweekly ?Modality: individual ?Progress: 30% ? ?Related Problem: Achieve a greater level of family connectedness. ?Description: Develop skills to talk openly and effectively with the children. ?Target Date: 2022-04-29 ?Frequency: Biweekly ?Modality: individual ?Progress: 20% ? ?Related Problem: Achieve a greater level of family connectedness. ?Description: Freely express feelings of frustration, helplessness, and inadequacy that each experiences in the parenting role. ?Target Date: 2022-04-29 ?Frequency: Biweekly ?Modality: individual ?Progress: 40% ? ?Related Problem: Achieve a greater level of family connectedness. ?Description: Identify unresolved childhood issues that affect parenting and work toward their resolution. ?Target Date: 2022-04-29 ?Frequency: Biweekly ?Modality: individual ?Progress: 40% ? ?Related Problem: Achieve a greater level of family connectedness. ?Description: Learn and implement parenting practices that have demonstrated effectiveness. ?Target Date: 2022-04-29 ?Frequency: Biweekly ?Modality: individual ?Progress: 20% ? ?Related Problem: Alleviate depressive symptoms and return to previous level of effective functioning. ?Description: Learn and implement conflict resolution skills to resolve interpersonal problems. ?Target Date: 2022-04-29 ?Frequency: Biweekly ?Modality: individual ?Progress: 10% ? ?Related Problem:  Alleviate depressive symptoms and return to previous level of effective functioning. ?Description: Implement mindfulness techniques for relapse prevention. ?Target Date: 2022-04-29 ?Frequency: Biweekly ?Modality: individual ?Progress: 10% ? ?Related Problem: Alleviate depressive symptoms and return to previous level of effective functioning. ?Description: Learn and implement problem-solving and decision-making skills. ?Target Date: 2022-04-29 ?Frequency: Biweekly ?Modality: individual ?Progress: 20% ? ?Related Problem: Develop healthy interpersonal relationships that lead to the alleviation and help prevent the relapse of depression. ?Description: Identify and replace thoughts  and beliefs that support depression. ?Target Date: 2022-04-29 ?Frequency: Biweekly ?Modality: individual ?Progress: 50% ? ?Related Problem: Develop healthy interpersonal relationships that lead to the alleviation and help prevent the relapse of depression. ?Description: Verbalize insight into how past relationships may be influencing current experiences with depression. ?Target Date: 2022-04-29 ?Frequency: Biweekly ?Modality: individual ?Progress: 40% ? ?Related Problem: Develop healthy interpersonal relationships that lead to the alleviation and help prevent the relapse of depression. ?Description: Verbalize an understanding and resolution of current interpersonal problems. ?Target Date: 2022-04-29 ?Frequency: Biweekly ?Modality: individual ?Progress: 40% ?Planned Intervention: For interpersonal deficits, help the client develop new interpersonal skills and relationships.  ?Related Problem: Develop healthy interpersonal relationships that lead to the alleviation and help prevent the relapse of depression. ?Description: Increasingly verbalize hopeful and positive statements regarding self, others, and the future. ?Target Date: 2022-04-29 ?Frequency: Biweekly ?Modality: individual ?Progress: 60% ? ?Related Problem: Develop healthy interpersonal  relationships that lead to the alleviation and help prevent the relapse of depression. ?Description: Identify important people in life, past and present, and describe the quality, good and poor, of those relationships. ?Target Date: 2022-04-29 ?Frequency: Biweekly ?Modality: individual ?Progress: 60%  ?Related Problem: Develop healthy interpersonal relationships that lead to the alleviation and help prevent the relapse of depression. ?Description: Learn and implement behavioral strategies to overcome depression. ?Target Date: 2022-04-29 ?Frequency: Biweekly ?Modality: individual ?Progress: 60% ?Planned Intervention: Engage the client in "behavioral activation," increasing his/her activity level and contact with sources of reward, while identifying processes that inhibit activation (see Behavioral Activation for Depression by Beverly Gust, Dimidjian, and Herman-Dunn; or assign "Identify and Schedule Pleasant Activities" in the Adult Psychotherapy Homework Planner by [facility]); use behavioral techniques such as instruction, rehearsal, role-playing, role reversal, as needed, to facilitate activity in the client's daily life; reinforce success.  ?Planned Intervention: Assist the client in developing skills that increase the likelihood of deriving pleasure from behavioral activation (e.g., assertiveness skills, developing an exercise plan, less internal/more external focus, increased social involvement); reinforce success.  ?Related Problem: Reach a personal balance between solitary time and interpersonal interaction with others. ?Description: Verbalize and demonstrate an understanding and resolution of current interpersonal problems. ?Target Date: 2022-04-29 ?Frequency: Biweekly ?Modality: individual ?Progress: 0%  ?Related Problem: Reach a personal balance between solitary time and interpersonal interaction with others. ?Description: Identify, challenge, and replace biased, fearful self-talk with reality-based, positive  self-talk. ?Target Date: 2021-04-29 ?Frequency: Biweekly ?Modality: individual ?Progress: 0% ? ?Related Problem: Reach a personal balance between solitary time and interpersonal interaction with others. ?Descri

## 2022-02-13 ENCOUNTER — Ambulatory Visit: Payer: 59 | Admitting: Psychology

## 2022-02-17 ENCOUNTER — Ambulatory Visit: Payer: 59 | Admitting: Psychology

## 2022-02-26 ENCOUNTER — Ambulatory Visit (INDEPENDENT_AMBULATORY_CARE_PROVIDER_SITE_OTHER): Payer: 59 | Admitting: Psychology

## 2022-02-26 DIAGNOSIS — F3130 Bipolar disorder, current episode depressed, mild or moderate severity, unspecified: Secondary | ICD-10-CM

## 2022-02-26 NOTE — Progress Notes (Signed)
[place] ? ?PROGRESS NOTE: ? ?Name: Joel Mayer ?MRN: [id number] ?DOB: 10-Jul-1976 ?PCP: Shon Baton, MD ? ?Date: 02/26/2022 ?Start: 08:00 AM ?End:  08:55 AM ? ?Annual Review: 04/29/2022 ? 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  ?Depressed or irritable mood.  ?Diminished interest in or enjoyment of activities.  ?Displays deficits in parenting knowledge and skills.  ?Expresses feelings of inadequacy in setting effective limits with their child.  ?History of chronic or recurrent depression for which the client has taken antidepressant medication, been hospitalized, had outpatient treatment, or had a course of electroconvulsive therapy.  ?Hypersensitivity to the criticism or disapproval of others.  ?Lack of energy.  ?Lacks knowledge regarding reasonable expectations for a child's behavior at a given developmental level. ?New Symptom - agitation, angry outbursts ?Poor concentration and indecisiveness.  ?Reluctant involvement in social situations out of fear of saying or doing something foolish or of becoming emotional in front of others.  ?Reports difficulty in managing the challenging problem behavior of their child. ?Social withdrawal. ?Medication Status ?compliance  ?Safety ?none  ?If Suicidal or Homicidal State Action Taken: unspecified  ?Current Risk: low ?Medications ?unspecified ?Objectives ?Related Problem: Achieve a greater level of family connectedness. ?Description: Verbalize a sense of increased skill, effectiveness, and confidence in parenting. ?Target Date: 2022-04-29 ?Frequency: Biweekly ?Modality: individual ?Progress: 30% ? ?Related Problem: Achieve a greater level of family connectedness. ?Description: Parents expand repertoire of parenting options. ?Target Date: 2022-04-29 ?Frequency: Biweekly ?Modality:  individual ?Progress: 30% ? ?Related Problem: Achieve a greater level of family connectedness. ?Description: Develop skills to talk openly and effectively with the children. ?Target Date: 2022-04-29 ?Frequency: Biweekly ?Modality: individual ?Progress: 20% ? ?Related Problem: Achieve a greater level of family connectedness. ?Description: Freely express feelings of frustration, helplessness, and inadequacy that each experiences in the parenting role. ?Target Date: 2022-04-29 ?Frequency: Biweekly ?Modality: individual ?Progress: 40% ? ?Related Problem: Achieve a greater level of family connectedness. ?Description: Identify unresolved childhood issues that affect parenting and work toward their resolution. ?Target Date: 2022-04-29 ?Frequency: Biweekly ?Modality: individual ?Progress: 40% ? ?Related Problem: Achieve a greater level of family connectedness. ?Description: Learn and implement parenting practices that have demonstrated effectiveness. ?Target Date: 2022-04-29 ?Frequency: Biweekly ?Modality: individual ?Progress: 20% ? ?Related Problem: Alleviate depressive symptoms and return to previous level of effective functioning. ?Description: Learn and implement conflict resolution skills to resolve interpersonal problems. ?Target Date: 2022-04-29 ?Frequency: Biweekly ?Modality: individual ?Progress: 10% ? ?Related Problem: Alleviate depressive symptoms and return to previous level of effective functioning. ?Description: Implement mindfulness techniques for relapse prevention. ?Target Date: 2022-04-29 ?Frequency: Biweekly ?Modality: individual ?Progress: 10% ? ?Related Problem: Alleviate depressive symptoms and return to previous level of effective functioning. ?Description: Learn and implement problem-solving and decision-making skills. ?Target Date: 2022-04-29 ?Frequency: Biweekly ?Modality: individual ?Progress: 20% ? ?Related Problem: Develop healthy interpersonal relationships that lead to the alleviation and  help prevent the relapse of depression. ?Description: Identify and replace thoughts and beliefs that support depression. ?Target Date: 2022-04-29 ?Frequency: Biweekly ?Modality: individual ?Progress: 50% ? ?Related Problem: Develop healthy interpersonal relationships that lead to the alleviation and help prevent the relapse of depression. ?Description: Verbalize insight into how past relationships may be influencing current experiences with depression. ?Target Date: 2022-04-29 ?Frequency: Biweekly ?Modality: individual ?Progress: 40% ? ?Related Problem: Develop healthy interpersonal relationships that lead to the alleviation and help prevent the relapse of depression. ?Description: Verbalize an understanding and  resolution of current interpersonal problems. ?Target Date: 2022-04-29 ?Frequency: Biweekly ?Modality: individual ?Progress: 40% ?Planned Intervention: For interpersonal deficits, help the client develop new interpersonal skills and relationships.  ?Related Problem: Develop healthy interpersonal relationships that lead to the alleviation and help prevent the relapse of depression. ?Description: Increasingly verbalize hopeful and positive statements regarding self, others, and the future. ?Target Date: 2022-04-29 ?Frequency: Biweekly ?Modality: individual ?Progress: 60% ? ?Related Problem: Develop healthy interpersonal relationships that lead to the alleviation and help prevent the relapse of depression. ?Description: Identify important people in life, past and present, and describe the quality, good and poor, of those relationships. ?Target Date: 2022-04-29 ?Frequency: Biweekly ?Modality: individual ?Progress: 60%  ?Related Problem: Develop healthy interpersonal relationships that lead to the alleviation and help prevent the relapse of depression. ?Description: Learn and implement behavioral strategies to overcome depression. ?Target Date: 2022-04-29 ?Frequency: Biweekly ?Modality: individual ?Progress:  60% ?Planned Intervention: Engage the client in "behavioral activation," increasing his/her activity level and contact with sources of reward, while identifying processes that inhibit activation (see Behavioral Activation for Depression by Beverly Gust, Dimidjian, and Herman-Dunn; or assign "Identify and Schedule Pleasant Activities" in the Adult Psychotherapy Homework Planner by [facility]); use behavioral techniques such as instruction, rehearsal, role-playing, role reversal, as needed, to facilitate activity in the client's daily life; reinforce success.  ?Planned Intervention: Assist the client in developing skills that increase the likelihood of deriving pleasure from behavioral activation (e.g., assertiveness skills, developing an exercise plan, less internal/more external focus, increased social involvement); reinforce success.  ?Related Problem: Reach a personal balance between solitary time and interpersonal interaction with others. ?Description: Verbalize and demonstrate an understanding and resolution of current interpersonal problems. ?Target Date: 2022-04-29 ?Frequency: Biweekly ?Modality: individual ?Progress: 0%  ?Related Problem: Reach a personal balance between solitary time and interpersonal interaction with others. ?Description: Identify, challenge, and replace biased, fearful self-talk with reality-based, positive self-talk. ?Target Date: 2021-04-29 ?Frequency: Biweekly ?Modality: individual ?Progress: 0% ? ?Related Problem: Reach a personal balance between solitary time and interpersonal interaction with others. ?Description: Learn and implement calming and coping strategies to manage anxiety symptoms during moments of social anxiety and lead to a more relaxed state in general. ?Target Date: 2022-04-29 ?Frequency: Biweekly ?Modality: individual ?Progress: 0% ? ?Related Problem: Reach a personal balance between solitary time and interpersonal interaction with others. ?Description: Verbalize an accurate  understanding of the vicious cycle of social anxiety and avoidance. ?Target Date: 2022-04-29 ?Frequency: Biweekly ?Modality: individual ?Progress: 0% ? ?Related Problem: Reach a realistic view and approach to parenti

## 2022-03-03 ENCOUNTER — Ambulatory Visit (INDEPENDENT_AMBULATORY_CARE_PROVIDER_SITE_OTHER): Payer: 59 | Admitting: Psychology

## 2022-03-03 DIAGNOSIS — F320 Major depressive disorder, single episode, mild: Secondary | ICD-10-CM | POA: Diagnosis not present

## 2022-03-03 NOTE — Progress Notes (Signed)
[facility] ? ?PROGRESS NOTE: ? ?Name: Joel Mayer ?MRN: [id number] ?DOB: 04/03/1976 ?PCP: Shon Baton, MD ? ?Date: 03/03/2022 ?Start: 08:00 AM ?End:  08:55 AM ? ?Annual Review: 04/29/2022 ? 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  ?Depressed or irritable mood.  ?Diminished interest in or enjoyment of activities.  ?Displays deficits in parenting knowledge and skills.  ?Expresses feelings of inadequacy in setting effective limits with their child.  ?History of chronic or recurrent depression for which the client has taken antidepressant medication, been hospitalized, had outpatient treatment, or had a course of electroconvulsive therapy.  ?Hypersensitivity to the criticism or disapproval of others.  ?Lack of energy.  ?Lacks knowledge regarding reasonable expectations for a child's behavior at a given developmental level. ?New Symptom - agitation, angry outbursts ?Poor concentration and indecisiveness.  ?Reluctant involvement in social situations out of fear of saying or doing something foolish or of becoming emotional in front of others.  ?Reports difficulty in managing the challenging problem behavior of their child. ?Social withdrawal. ?Medication Status ?compliance  ?Safety ?none  ?If Suicidal or Homicidal State Action Taken: unspecified  ?Current Risk: low ?Medications ?unspecified ?Objectives ?Related Problem: Achieve a greater level of family connectedness. ?Description: Verbalize a sense of increased skill, effectiveness, and confidence in parenting. ?Target Date: 2022-04-29 ?Frequency: Biweekly ?Modality: individual ?Progress: 30% ? ?Related Problem: Achieve a greater level of family connectedness. ?Description: Parents expand repertoire of parenting options. ?Target Date: 2022-04-29 ?Frequency: Biweekly ?Modality:  individual ?Progress: 30% ? ?Related Problem: Achieve a greater level of family connectedness. ?Description: Develop skills to talk openly and effectively with the children. ?Target Date: 2022-04-29 ?Frequency: Biweekly ?Modality: individual ?Progress: 20% ? ?Related Problem: Achieve a greater level of family connectedness. ?Description: Freely express feelings of frustration, helplessness, and inadequacy that each experiences in the parenting role. ?Target Date: 2022-04-29 ?Frequency: Biweekly ?Modality: individual ?Progress: 40% ? ?Related Problem: Achieve a greater level of family connectedness. ?Description: Identify unresolved childhood issues that affect parenting and work toward their resolution. ?Target Date: 2022-04-29 ?Frequency: Biweekly ?Modality: individual ?Progress: 40% ? ?Related Problem: Achieve a greater level of family connectedness. ?Description: Learn and implement parenting practices that have demonstrated effectiveness. ?Target Date: 2022-04-29 ?Frequency: Biweekly ?Modality: individual ?Progress: 20% ? ?Related Problem: Alleviate depressive symptoms and return to previous level of effective functioning. ?Description: Learn and implement conflict resolution skills to resolve interpersonal problems. ?Target Date: 2022-04-29 ?Frequency: Biweekly ?Modality: individual ?Progress: 10% ? ?Related Problem: Alleviate depressive symptoms and return to previous level of effective functioning. ?Description: Implement mindfulness techniques for relapse prevention. ?Target Date: 2022-04-29 ?Frequency: Biweekly ?Modality: individual ?Progress: 10% ? ?Related Problem: Alleviate depressive symptoms and return to previous level of effective functioning. ?Description: Learn and implement problem-solving and decision-making skills. ?Target Date: 2022-04-29 ?Frequency: Biweekly ?Modality: individual ?Progress: 20% ? ?Related Problem: Develop healthy interpersonal relationships that lead to the alleviation and  help prevent the relapse of depression. ?Description: Identify and replace thoughts and beliefs that support depression. ?Target Date: 2022-04-29 ?Frequency: Biweekly ?Modality: individual ?Progress: 50% ? ?Related Problem: Develop healthy interpersonal relationships that lead to the alleviation and help prevent the relapse of depression. ?Description: Verbalize insight into how past relationships may be influencing current experiences with depression. ?Target Date: 2022-04-29 ?Frequency: Biweekly ?Modality: individual ?Progress: 40% ? ?Related Problem: Develop healthy interpersonal relationships that lead to the alleviation and help prevent the relapse of depression. ?Description: Verbalize an understanding and  resolution of current interpersonal problems. ?Target Date: 2022-04-29 ?Frequency: Biweekly ?Modality: individual ?Progress: 40% ?Planned Intervention: For interpersonal deficits, help the client develop new interpersonal skills and relationships.  ?Related Problem: Develop healthy interpersonal relationships that lead to the alleviation and help prevent the relapse of depression. ?Description: Increasingly verbalize hopeful and positive statements regarding self, others, and the future. ?Target Date: 2022-04-29 ?Frequency: Biweekly ?Modality: individual ?Progress: 60% ? ?Related Problem: Develop healthy interpersonal relationships that lead to the alleviation and help prevent the relapse of depression. ?Description: Identify important people in life, past and present, and describe the quality, good and poor, of those relationships. ?Target Date: 2022-04-29 ?Frequency: Biweekly ?Modality: individual ?Progress: 60%  ?Related Problem: Develop healthy interpersonal relationships that lead to the alleviation and help prevent the relapse of depression. ?Description: Learn and implement behavioral strategies to overcome depression. ?Target Date: 2022-04-29 ?Frequency: Biweekly ?Modality: individual ?Progress:  60% ?Planned Intervention: Engage the client in "behavioral activation," increasing his/her activity level and contact with sources of reward, while identifying processes that inhibit activation (see Behavioral Activation for Depression by Beverly Gust, Dimidjian, and Herman-Dunn; or assign "Identify and Schedule Pleasant Activities" in the Adult Psychotherapy Homework Planner by [facility]); use behavioral techniques such as instruction, rehearsal, role-playing, role reversal, as needed, to facilitate activity in the client's daily life; reinforce success.  ?Planned Intervention: Assist the client in developing skills that increase the likelihood of deriving pleasure from behavioral activation (e.g., assertiveness skills, developing an exercise plan, less internal/more external focus, increased social involvement); reinforce success.  ?Related Problem: Reach a personal balance between solitary time and interpersonal interaction with others. ?Description: Verbalize and demonstrate an understanding and resolution of current interpersonal problems. ?Target Date: 2022-04-29 ?Frequency: Biweekly ?Modality: individual ?Progress: 0%  ?Related Problem: Reach a personal balance between solitary time and interpersonal interaction with others. ?Description: Identify, challenge, and replace biased, fearful self-talk with reality-based, positive self-talk. ?Target Date: 2021-04-29 ?Frequency: Biweekly ?Modality: individual ?Progress: 0% ? ?Related Problem: Reach a personal balance between solitary time and interpersonal interaction with others. ?Description: Learn and implement calming and coping strategies to manage anxiety symptoms during moments of social anxiety and lead to a more relaxed state in general. ?Target Date: 2022-04-29 ?Frequency: Biweekly ?Modality: individual ?Progress: 0% ? ?Related Problem: Reach a personal balance between solitary time and interpersonal interaction with others. ?Description: Verbalize an accurate  understanding of the vicious cycle of social anxiety and avoidance. ?Target Date: 2022-04-29 ?Frequency: Biweekly ?Modality: individual ?Progress: 0% ? ?Related Problem: Reach a realistic view and approach to parenti

## 2022-03-17 ENCOUNTER — Ambulatory Visit (INDEPENDENT_AMBULATORY_CARE_PROVIDER_SITE_OTHER): Payer: 59 | Admitting: Psychology

## 2022-03-17 DIAGNOSIS — F320 Major depressive disorder, single episode, mild: Secondary | ICD-10-CM

## 2022-03-17 NOTE — Progress Notes (Signed)
? [place] ? ?PROGRESS NOTE: ? ?Name: Joel Mayer ?MRN: [id number] ?DOB: [birth year] ?PCP: Shon Baton, MD ? ?Date:  03/17/2022 ?Start: 08:00 AM ?End:  08:55 AM ? ?Annual Review: 04/29/2022 ? 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  ?Depressed or irritable mood.  ?Diminished interest in or enjoyment of activities.  ?Displays deficits in parenting knowledge and skills.  ?Expresses feelings of inadequacy in setting effective limits with their child.  ?History of chronic or recurrent depression for which the client has taken antidepressant medication, been hospitalized, had outpatient treatment, or had a course of electroconvulsive therapy.  ?Hypersensitivity to the criticism or disapproval of others.  ?Lack of energy.  ?Lacks knowledge regarding reasonable expectations for a child's behavior at a given developmental level. ?New Symptom - agitation, angry outbursts ?Poor concentration and indecisiveness.  ?Reluctant involvement in social situations out of fear of saying or doing something foolish or of becoming emotional in front of others.  ?Reports difficulty in managing the challenging problem behavior of their child. ?Social withdrawal. ?Medication Status ?compliance  ?Safety ?none  ?If Suicidal or Homicidal State Action Taken: unspecified  ?Current Risk: low ?Medications ?unspecified ?Objectives ?Related Problem: Achieve a greater level of family connectedness. ?Description: Verbalize a sense of increased skill, effectiveness, and confidence in parenting. ?Target Date: 2022-04-29 ?Frequency: Biweekly ?Modality: individual ?Progress: 30% ? ?Related Problem: Achieve a greater level of family connectedness. ?Description: Parents expand repertoire of parenting options. ?Target Date: 2022-04-29 ?Frequency: Biweekly ?Modality:  individual ?Progress: 30% ? ?Related Problem: Achieve a greater level of family connectedness. ?Description: Develop skills to talk openly and effectively with the children. ?Target Date: 2022-04-29 ?Frequency: Biweekly ?Modality: individual ?Progress: 20% ? ?Related Problem: Achieve a greater level of family connectedness. ?Description: Freely express feelings of frustration, helplessness, and inadequacy that each experiences in the parenting role. ?Target Date: 2022-04-29 ?Frequency: Biweekly ?Modality: individual ?Progress: 40% ? ?Related Problem: Achieve a greater level of family connectedness. ?Description: Identify unresolved childhood issues that affect parenting and work toward their resolution. ?Target Date: 2022-04-29 ?Frequency: Biweekly ?Modality: individual ?Progress: 40% ? ?Related Problem: Achieve a greater level of family connectedness. ?Description: Learn and implement parenting practices that have demonstrated effectiveness. ?Target Date: 2022-04-29 ?Frequency: Biweekly ?Modality: individual ?Progress: 20% ? ?Related Problem: Alleviate depressive symptoms and return to previous level of effective functioning. ?Description: Learn and implement conflict resolution skills to resolve interpersonal problems. ?Target Date: 2022-04-29 ?Frequency: Biweekly ?Modality: individual ?Progress: 10% ? ?Related Problem: Alleviate depressive symptoms and return to previous level of effective functioning. ?Description: Implement mindfulness techniques for relapse prevention. ?Target Date: 2022-04-29 ?Frequency: Biweekly ?Modality: individual ?Progress: 10% ? ?Related Problem: Alleviate depressive symptoms and return to previous level of effective functioning. ?Description: Learn and implement problem-solving and decision-making skills. ?Target Date: 2022-04-29 ?Frequency: Biweekly ?Modality: individual ?Progress: 20% ? ?Related Problem: Develop healthy interpersonal relationships that lead to the alleviation and  help prevent the relapse of depression. ?Description: Identify and replace thoughts and beliefs that support depression. ?Target Date: 2022-04-29 ?Frequency: Biweekly ?Modality: individual ?Progress: 50% ? ?Related Problem: Develop healthy interpersonal relationships that lead to the alleviation and help prevent the relapse of depression. ?Description: Verbalize insight into how past relationships may be influencing current experiences with depression. ?Target Date: 2022-04-29 ?Frequency: Biweekly ?Modality: individual ?Progress: 40% ? ?Related Problem: Develop healthy interpersonal relationships that lead to the alleviation and help prevent the relapse of depression. ?Description: Verbalize an  understanding and resolution of current interpersonal problems. ?Target Date: 2022-04-29 ?Frequency: Biweekly ?Modality: individual ?Progress: 40% ?Planned Intervention: For interpersonal deficits, help the client develop new interpersonal skills and relationships.  ?Related Problem: Develop healthy interpersonal relationships that lead to the alleviation and help prevent the relapse of depression. ?Description: Increasingly verbalize hopeful and positive statements regarding self, others, and the future. ?Target Date: 2022-04-29 ?Frequency: Biweekly ?Modality: individual ?Progress: 60% ? ?Related Problem: Develop healthy interpersonal relationships that lead to the alleviation and help prevent the relapse of depression. ?Description: Identify important people in life, past and present, and describe the quality, good and poor, of those relationships. ?Target Date: 2022-04-29 ?Frequency: Biweekly ?Modality: individual ?Progress: 60%  ?Related Problem: Develop healthy interpersonal relationships that lead to the alleviation and help prevent the relapse of depression. ?Description: Learn and implement behavioral strategies to overcome depression. ?Target Date: 2022-04-29 ?Frequency: Biweekly ?Modality: individual ?Progress:  60% ?Planned Intervention: Engage the client in "behavioral activation," increasing his/her activity level and contact with sources of reward, while identifying processes that inhibit activation (see Behavioral Activation for Depression by Beverly Gust, Dimidjian, and Herman-Dunn; or assign "Identify and Schedule Pleasant Activities" in the Adult Psychotherapy Homework Planner by [facility]); use behavioral techniques such as instruction, rehearsal, role-playing, role reversal, as needed, to facilitate activity in the client's daily life; reinforce success.  ?Planned Intervention: Assist the client in developing skills that increase the likelihood of deriving pleasure from behavioral activation (e.g., assertiveness skills, developing an exercise plan, less internal/more external focus, increased social involvement); reinforce success.  ?Related Problem: Reach a personal balance between solitary time and interpersonal interaction with others. ?Description: Verbalize and demonstrate an understanding and resolution of current interpersonal problems. ?Target Date: 2022-04-29 ?Frequency: Biweekly ?Modality: individual ?Progress: 0%  ?Related Problem: Reach a personal balance between solitary time and interpersonal interaction with others. ?Description: Identify, challenge, and replace biased, fearful self-talk with reality-based, positive self-talk. ?Target Date: 2021-04-29 ?Frequency: Biweekly ?Modality: individual ?Progress: 0% ? ?Related Problem: Reach a personal balance between solitary time and interpersonal interaction with others. ?Description: Learn and implement calming and coping strategies to manage anxiety symptoms during moments of social anxiety and lead to a more relaxed state in general. ?Target Date: 2022-04-29 ?Frequency: Biweekly ?Modality: individual ?Progress: 0% ? ?Related Problem: Reach a personal balance between solitary time and interpersonal interaction with others. ?Description: Verbalize an accurate  understanding of the vicious cycle of social anxiety and avoidance. ?Target Date: 2022-04-29 ?Frequency: Biweekly ?Modality: individual ?Progress: 0% ? ?Related Problem: Reach a realistic view and approach to pare

## 2022-03-31 ENCOUNTER — Ambulatory Visit (INDEPENDENT_AMBULATORY_CARE_PROVIDER_SITE_OTHER): Payer: 59 | Admitting: Psychology

## 2022-03-31 DIAGNOSIS — F320 Major depressive disorder, single episode, mild: Secondary | ICD-10-CM

## 2022-03-31 NOTE — Progress Notes (Signed)
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  Depressed or irritable mood.  Diminished interest in or enjoyment of activities.  Displays deficits in parenting knowledge and skills.  Expresses feelings of inadequacy in setting effective limits with their child.  History of chronic or recurrent depression for which the client has taken antidepressant medication, been hospitalized, had outpatient treatment, or had a course of electroconvulsive therapy.  Hypersensitivity to the criticism or disapproval of others.  Lack of energy.  Lacks knowledge regarding reasonable expectations for a child's behavior at a given developmental level. New Symptom - agitation, angry outbursts Poor concentration and indecisiveness.  Reluctant involvement in social situations out of fear of saying or doing something foolish or of becoming emotional in front of others.  Reports difficulty in managing the challenging problem behavior of their child. Social withdrawal. Medication Status compliance  Safety none  If Suicidal or Homicidal State Action Taken: unspecified  Current Risk: low Medications unspecified Objectives Related Problem: Achieve a greater level of family connectedness. Description: Verbalize a sense of increased skill, effectiveness, and confidence in parenting. Target Date: 2022-04-29 Frequency: Biweekly Modality: individual Progress: 30%  Related Problem: Achieve a greater level of family connectedness. Description: Parents expand repertoire of parenting options. Target Date: 2022-04-29 Frequency: Biweekly Modality:  individual Progress: 30%  Related Problem: Achieve a greater level of family connectedness. Description: Develop skills to talk openly and effectively with the children. Target Date: 2022-04-29 Frequency: Biweekly Modality: individual Progress: 20%  Related Problem: Achieve a greater level of family connectedness. Description: Freely express feelings of frustration, helplessness, and inadequacy that each experiences in the parenting role. Target Date: 2022-04-29 Frequency: Biweekly Modality: individual Progress: 40%  Related Problem: Achieve a greater level of family connectedness. Description: Identify unresolved childhood issues that affect parenting and work toward their resolution. Target Date: 2022-04-29 Frequency: Biweekly Modality: individual Progress: 40%  Related Problem: Achieve a greater level of family connectedness. Description: Learn and implement parenting practices that have demonstrated effectiveness. Target Date: 2022-04-29 Frequency: Biweekly Modality: individual Progress: 20%  Related Problem: Alleviate depressive symptoms and return to previous level of effective functioning. Description: Learn and implement conflict resolution skills to resolve interpersonal problems. Target Date: 2022-04-29 Frequency: Biweekly Modality: individual Progress: 10%  Related Problem: Alleviate depressive symptoms and return to previous level of effective functioning. Description: Implement mindfulness techniques for relapse prevention. Target Date: 2022-04-29 Frequency: Biweekly Modality: individual Progress: 10%  Related Problem: Alleviate depressive symptoms and return to previous level of effective functioning. Description: Learn and implement problem-solving and decision-making skills. Target Date: 2022-04-29 Frequency: Biweekly Modality: individual Progress: 20%  Related Problem: Develop healthy interpersonal relationships that lead to the alleviation and  help prevent the relapse of depression. Description: Identify and replace thoughts and beliefs that support depression. Target Date: 2022-04-29 Frequency: Biweekly Modality: individual Progress: 50%  Related Problem: Develop healthy interpersonal relationships that lead to the alleviation and help prevent the relapse of depression. Description: Verbalize insight into how past relationships may be influencing current experiences with depression. Target Date: 2022-04-29 Frequency: Biweekly Modality: individual Progress: 40%  Related Problem: Develop healthy interpersonal relationships that lead to the alleviation and help prevent the relapse of depression. Description: Verbalize an  understanding and resolution of current interpersonal problems. Target Date: 2022-04-29 Frequency: Biweekly Modality: individual Progress: 40% Planned Intervention: For interpersonal deficits, help the client develop new interpersonal skills and relationships.  Related Problem: Develop healthy interpersonal relationships that lead to the alleviation and help prevent the relapse of depression. Description: Increasingly verbalize hopeful and positive statements regarding self, others, and the future. Target Date: 2022-04-29 Frequency: Biweekly Modality: individual Progress: 60%  Related Problem: Develop healthy interpersonal relationships that lead to the alleviation and help prevent the relapse of depression. Description: Identify important people in life, past and present, and describe the quality, good and poor, of those relationships. Target Date: 2022-04-29 Frequency: Biweekly Modality: individual Progress: 60%  Related Problem: Develop healthy interpersonal relationships that lead to the alleviation and help prevent the relapse of depression. Description: Learn and implement behavioral strategies to overcome depression. Target Date: 2022-04-29 Frequency: Biweekly Modality: individual Progress:  60% Planned Intervention: Engage the client in "behavioral activation," increasing his/her activity level and contact with sources of reward, while identifying processes that inhibit activation (see Behavioral Activation for Depression by Beverly Gust, Dimidjian, and Herman-Dunn; or assign "Identify and Schedule Pleasant Activities" in the Adult Psychotherapy Homework Planner by [facility]); use behavioral techniques such as instruction, rehearsal, role-playing, role reversal, as needed, to facilitate activity in the client's daily life; reinforce success.  Planned Intervention: Assist the client in developing skills that increase the likelihood of deriving pleasure from behavioral activation (e.g., assertiveness skills, developing an exercise plan, less internal/more external focus, increased social involvement); reinforce success.  Related Problem: Reach a personal balance between solitary time and interpersonal interaction with others. Description: Verbalize and demonstrate an understanding and resolution of current interpersonal problems. Target Date: 2022-04-29 Frequency: Biweekly Modality: individual Progress: 0%  Related Problem: Reach a personal balance between solitary time and interpersonal interaction with others. Description: Identify, challenge, and replace biased, fearful self-talk with reality-based, positive self-talk. Target Date: 2021-04-29 Frequency: Biweekly Modality: individual Progress: 0%  Related Problem: Reach a personal balance between solitary time and interpersonal interaction with others. Description: Learn and implement calming and coping strategies to manage anxiety symptoms during moments of social anxiety and lead to a more relaxed state in general. Target Date: 2022-04-29 Frequency: Biweekly Modality: individual Progress: 0%  Related Problem: Reach a personal balance between solitary time and interpersonal interaction with others. Description: Verbalize an accurate  understanding of the vicious cycle of social anxiety and avoidance. Target Date: 2022-04-29 Frequency: Biweekly Modality: individual Progress: 0%  Related Problem: Reach a realistic view and approach to parenting, given the child's developmental level. Description: Verbalize an increased awareness and understanding of the unique issues and trials of parenting adolescents. Target Date: 2022-04-29 Frequency: Biweekly Modality: individual Progress: 20%  Related Problem: Reach a realistic view and approach to parenting, given the child's developmental level. Description: Parents and child report an increased feeling of connectedness between them. Target Date: 2022-04-29 Frequency: Biweekly Modality: individual Progress: 30%  Client Response full compliance  Service Location Location, [address] [place], [place] [address]  Service Code cpt [phone])  Facilitate problem solving  Identify automatic thoughts  Rationally challenge thoughts or beliefs/cognitive restructuring  Normalize/Reframe  Validate/empathize  Identify/label emotions  Made connections between past and present Grief and loss work   Stanly [employer] and I met today in remote video (WebEx) individual face to face therapy as an accommodation during the COVID-19 pandemic.   Distance Site: Client Home  Originating Site: Dr. Ronita Hipps Remote Office  Consent: Obtained verbal consent  to transmit session remotely    [name] reports that things have been hectic this last leg of the school year.  He shared some of the difficulties his girls are having with one of their teachers.  We d/p that he is proud of his girls.  We d/ where his daughters are at and his parenting style light of his own upbringing.  It was a good time to provide praise and support for the growth he has made this far in his therapy related to his engagement with his family.   Progress: Patient demonstrates improved anxiety particularly around  parenting issues. By his own report he continues to have longer periods of better mood states and a decrease in frequency of obsessive thoughts. He is showing progress in becoming more positively engaged with his family.     [name], PhD

## 2022-04-14 ENCOUNTER — Ambulatory Visit (INDEPENDENT_AMBULATORY_CARE_PROVIDER_SITE_OTHER): Payer: 59 | Admitting: Psychology

## 2022-04-14 DIAGNOSIS — F411 Generalized anxiety disorder: Secondary | ICD-10-CM | POA: Diagnosis not present

## 2022-04-14 NOTE — Progress Notes (Signed)
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  Depressed or irritable mood.  Diminished interest in or enjoyment of activities.  Displays deficits in parenting knowledge and skills.  Expresses feelings of inadequacy in setting effective limits with their child.  History of chronic or recurrent depression for which the client has taken antidepressant medication, been hospitalized, had outpatient treatment, or had a course of electroconvulsive therapy.  Hypersensitivity to the criticism or disapproval of others.  Lack of energy.  Lacks knowledge regarding reasonable expectations for a child's behavior at a given developmental level. New Symptom - agitation, angry outbursts Poor concentration and indecisiveness.  Reluctant involvement in social situations out of fear of saying or doing something foolish or of becoming emotional in front of others.  Reports difficulty in managing the challenging problem behavior of their child. Social withdrawal. Medication Status compliance  Safety none  If Suicidal or Homicidal State Action Taken: unspecified  Current Risk: low Medications unspecified Objectives Related Problem: Achieve a greater level of family connectedness. Description: Verbalize a sense of increased skill, effectiveness, and confidence in parenting. Target Date: 2022-04-29 Frequency: Biweekly Modality: individual Progress: 30%  Related Problem: Achieve a greater level of family connectedness. Description: Parents expand repertoire of parenting options. Target Date: 2022-04-29 Frequency: Biweekly Modality:  individual Progress: 30%  Related Problem: Achieve a greater level of family connectedness. Description: Develop skills to talk openly and effectively with the children. Target Date: 2022-04-29 Frequency: Biweekly Modality: individual Progress: 20%  Related Problem: Achieve a greater level of family connectedness. Description: Freely express feelings of frustration, helplessness, and inadequacy that each experiences in the parenting role. Target Date: 2022-04-29 Frequency: Biweekly Modality: individual Progress: 40%  Related Problem: Achieve a greater level of family connectedness. Description: Identify unresolved childhood issues that affect parenting and work toward their resolution. Target Date: 2022-04-29 Frequency: Biweekly Modality: individual Progress: 40%  Related Problem: Achieve a greater level of family connectedness. Description: Learn and implement parenting practices that have demonstrated effectiveness. Target Date: 2022-04-29 Frequency: Biweekly Modality: individual Progress: 20%  Related Problem: Alleviate depressive symptoms and return to previous level of effective functioning. Description: Learn and implement conflict resolution skills to resolve interpersonal problems. Target Date: 2022-04-29 Frequency: Biweekly Modality: individual Progress: 10%  Related Problem: Alleviate depressive symptoms and return to previous level of effective functioning. Description: Implement mindfulness techniques for relapse prevention. Target Date: 2022-04-29 Frequency: Biweekly Modality: individual Progress: 10%  Related Problem: Alleviate depressive symptoms and return to previous level of effective functioning. Description: Learn and implement problem-solving and decision-making skills. Target Date: 2022-04-29 Frequency: Biweekly Modality: individual Progress: 20%  Related Problem: Develop healthy interpersonal relationships that lead to the alleviation and  help prevent the relapse of depression. Description: Identify and replace thoughts and beliefs that support depression. Target Date: 2022-04-29 Frequency: Biweekly Modality: individual Progress: 50%  Related Problem: Develop healthy interpersonal relationships that lead to the alleviation and help prevent the relapse of depression. Description: Verbalize insight into how past relationships may be influencing current experiences with depression. Target Date: 2022-04-29 Frequency: Biweekly Modality: individual Progress: 40%  Related Problem: Develop healthy interpersonal relationships that lead to the alleviation and help prevent the relapse of depression. Description: Verbalize an  understanding and resolution of current interpersonal problems. Target Date: 2022-04-29 Frequency: Biweekly Modality: individual Progress: 40% Planned Intervention: For interpersonal deficits, help the client develop new interpersonal skills and relationships.  Related Problem: Develop healthy interpersonal relationships that lead to the alleviation and help prevent the relapse of depression. Description: Increasingly verbalize hopeful and positive statements regarding self, others, and the future. Target Date: 2022-04-29 Frequency: Biweekly Modality: individual Progress: 60%  Related Problem: Develop healthy interpersonal relationships that lead to the alleviation and help prevent the relapse of depression. Description: Identify important people in life, past and present, and describe the quality, good and poor, of those relationships. Target Date: 2022-04-29 Frequency: Biweekly Modality: individual Progress: 60%  Related Problem: Develop healthy interpersonal relationships that lead to the alleviation and help prevent the relapse of depression. Description: Learn and implement behavioral strategies to overcome depression. Target Date: 2022-04-29 Frequency: Biweekly Modality: individual Progress:  60% Planned Intervention: Engage the client in "behavioral activation," increasing his/her activity level and contact with sources of reward, while identifying processes that inhibit activation (see Behavioral Activation for Depression by Beverly Gust, Dimidjian, and Herman-Dunn; or assign "Identify and Schedule Pleasant Activities" in the Adult Psychotherapy Homework Planner by [facility]); use behavioral techniques such as instruction, rehearsal, role-playing, role reversal, as needed, to facilitate activity in the client's daily life; reinforce success.  Planned Intervention: Assist the client in developing skills that increase the likelihood of deriving pleasure from behavioral activation (e.g., assertiveness skills, developing an exercise plan, less internal/more external focus, increased social involvement); reinforce success.  Related Problem: Reach a personal balance between solitary time and interpersonal interaction with others. Description: Verbalize and demonstrate an understanding and resolution of current interpersonal problems. Target Date: 2022-04-29 Frequency: Biweekly Modality: individual Progress: 0%  Related Problem: Reach a personal balance between solitary time and interpersonal interaction with others. Description: Identify, challenge, and replace biased, fearful self-talk with reality-based, positive self-talk. Target Date: 2021-04-29 Frequency: Biweekly Modality: individual Progress: 0%  Related Problem: Reach a personal balance between solitary time and interpersonal interaction with others. Description: Learn and implement calming and coping strategies to manage anxiety symptoms during moments of social anxiety and lead to a more relaxed state in general. Target Date: 2022-04-29 Frequency: Biweekly Modality: individual Progress: 0%  Related Problem: Reach a personal balance between solitary time and interpersonal interaction with others. Description: Verbalize an accurate  understanding of the vicious cycle of social anxiety and avoidance. Target Date: 2022-04-29 Frequency: Biweekly Modality: individual Progress: 0%  Related Problem: Reach a realistic view and approach to parenting, given the child's developmental level. Description: Verbalize an increased awareness and understanding of the unique issues and trials of parenting adolescents. Target Date: 2022-04-29 Frequency: Biweekly Modality: individual Progress: 20%  Related Problem: Reach a realistic view and approach to parenting, given the child's developmental level. Description: Parents and child report an increased feeling of connectedness between them. Target Date: 2022-04-29 Frequency: Biweekly Modality: individual Progress: 30%  Client Response full compliance  Service Location Location, [address] [place], [place] [address]  Service Code cpt [phone])  Facilitate problem solving  Identify automatic thoughts  Rationally challenge thoughts or beliefs/cognitive restructuring  Normalize/Reframe  Validate/empathize  Identify/label emotions  Made connections between past and present Grief and loss work   Izayah [employer] and I met today in remote video (WebEx) individual face to face therapy as an accommodation during the COVID-19 pandemic.   Distance Site: Client Home  Originating Site: Dr. Ronita Hipps Remote Office  Consent: Obtained verbal consent  to transmit session remotely    [name] reports that the family spent the holiday weekend at the beach.  They anticipated encountering several families from their [place] school.  His experiences with these families emphasized for both [name] and his wife that they need to make more of an effort to socialize on a more regular basis.  I encouraged him to set a small reachable goal to start (ie., make plans once a week).  We d/e/p the narratives he constructs that keep him from accomplishing his goal.  I also suggested he set an intention  for each day to help keep him mindful and focused on his goal(s) throughout the day.    Progress: Patient demonstrates improved anxiety particularly around parenting issues. By his own report he continues to have longer periods of better mood states and a decrease in frequency of obsessive thoughts. He is showing progress in becoming more positively engaged with his family.     [name], PhD

## 2022-04-28 ENCOUNTER — Ambulatory Visit (INDEPENDENT_AMBULATORY_CARE_PROVIDER_SITE_OTHER): Payer: 59 | Admitting: Psychology

## 2022-04-28 DIAGNOSIS — F325 Major depressive disorder, single episode, in full remission: Secondary | ICD-10-CM

## 2022-04-28 DIAGNOSIS — F4010 Social phobia, unspecified: Secondary | ICD-10-CM

## 2022-04-28 NOTE — Progress Notes (Signed)
[facility]  PROGRESS NOTE:  Name: Joel Mayer Date: 04/28/2022 MRN: [id number] DOB: [birth year] PCP: Shon Baton, MD  Time spent: 8:00-8:55 AM   Annual Review: 04/30/2023  Today I met with  Joel Mayer in remote video (WebEx) face-to-face individual psychotherapy.   Distance Site: Client's Home Orginating Site: Dr Jannifer Franklin Remote Office Consent: Obtained verbal consent to transmit  session remotely    In session, today we conducted pt's annual review.  We  d/ his treatment progress, reviewed goals and updated his treatment plan.  Joel Mayer participated in the creation of his treatment plan and gave his consent.      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   While he feels that things are better, he would like some assistance and moving forward.  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  He has been prescribed Prozac 60 mg 17 years.  He was in therapy with Dr. Rowe Robert until her retirement.  Dr. Rowe Robert referred Pt to this therapist.  Pt is married to [place] (45) married since September of '07.  This is a first marriage for both of them. They have two daughters together. Pt reports periodic conflicts with wife and need to be more involved with his daughters.  Mental Status Exam:  Appearance: Casual    Behavior: Appropriate Motor: Normal Speech/Language: Normal Rate Affect: Appropriate Mood: normal Thought process: normal Thought content: WNL Sensory/Perceptual disturbances: WNL Orientation: oriented to person Attention: Good Concentration: Good Memory: Immediate;   Good Fund of knowledge: Good Insight: Good Judgment: Good Impulse Control: Good  Risk Assessment: Danger to Self:  Yes.  with intent/plan Self-injurious Behavior: No Danger to Others: No Duty to Warn:no Physical Aggression / Violence:No  Access to Firearms a concern: No   Substance Abuse History: Current  substance abuse: No     Past Psychiatric History:   Previous psychological history is significant for anxiety and depression Outpatient Providers: one previous course of therapy - Dr. Julius Bowels History of Psych Hospitalization: No  Psychological Testing:  None    Living situation: the patient lives with their family  Sexual Orientation: Straight  Relationship Status: married  Name of spouse / other: [name] (8) If a parent, number of children / ages:  [name] ( 14) she is in a rising [name] (12) she is a rising Research scientist (medical) at [employer]:  No   Income/Employment/Disability: Employment  Armed forces logistics/support/administrative officer: No   Educational History: Education: post Forensic psychologist work or degree  Religion/Sprituality/World View: Atheist  Any cultural differences that may affect / interfere with treatment:  not applicable   Recreation/Hobbies: reading, golf  Stressors: Marital or family conflict    Barriers:  None  Legal History: Pending legal issue / charges: The patient has no significant history of legal issues. History of legal issue / charges:  None     Individualized Treatment Plan       Strengths: Intelligent, resourceful, loyal, good problem solver, persistent, deals with adversity, friendly, humorous  Supports: spouse, friends (mostly out of town), mother   Goal/Needs for Treatment:  In order of importance to patient 1) Learn and implement mindfulness techniques for the purposes of relapse prevention. 2) Increased skills, effectiveness, and confidence in parenting 3) For interpersonal deficits, help the client develop new interpersonal skills and relationships.  4) Develop healthy interpersonal relationships that lead to the alleviation and help prevent  the  relapse of depression.   Client Statement of Needs: Requires assistance of being more aware and mindful in his day to day life experience especially as his mood and behaviors impact his  relationships with his wife and children.  A focus on relapse prevention related to depression and maintenance, .   Treatment Level: Biweekly Individual Outpatient Psychotherapy  Symptoms:  History of chronic or recurrent depression for which the client takes antidepressant medication, diminished interest in or enjoyment of activities, hypersensitivity to the criticism or disapproval of others, Reluctant involvement in social situations out of fear of saying or doing something foolish or of becoming emotional in front of others, reports difficulty in managing the challenging problem behavior of their child, social withdrawal. Displays deficits in parenting knowledge and skills and lacks knowledge regarding reasonable expectations for a child's behavior at a given developmental level.  [name].  Client Treatment Preferences: continue treatment with current therapist   [name]:  Psychologist, [name], [name] will support the patient's ability to achieve the goals identified. Cognitive Behavioral Therapy, Dialectical Behavioral Therapy, Motivational Interviewing, behavior activation, parent training, and other evidenced-based practices will be used to promote progress towards healthy functioning.   [name] will: Actively participate in therapy, working towards healthy functioning.    *Justification for Continuation/Discontinuation of Goal: R=Revised, O=Ongoing, A=Achieved, D=Discontinued  Goal 1) Learn and implement mindfulness techniques for the purposes of relapse prevention. 5 Point Likert rating baseline date: 04/29/2021 Target Date Goal Was reviewed Status Code Progress towards goal/Likert rating  04/30/2023 04/28/2022          O 1/5 - pt has only a minimal level of the self awareness necessary to activate coping skills in a timely manner, unable to identify emotions             Goal 2) Increased skills, effectiveness,  and confidence in parenting 5 Point Likert rating baseline date: 04/29/2021 Target Date Goal Was reviewed Status Code Progress towards goal/Likert rating  04/30/2023 04/28/2022          O 2/5 - increased awareness of children's needs, increased level of attachment and engagement             Goal 3) For interpersonal deficits, help the client develop new interpersonal skills and relationships.  5 Point Likert rating baseline date: 04/29/2021 Target Date Goal Was reviewed Status Code Progress towards goal/Likert rating  04/30/2023 04/28/2022           O 1/5 - pt continues to avoid social opportunities, makes little effort to mobilize around goal to improve interpersonal interactions outside of immediate family              Goal 4) Develop healthy interpersonal relationships that lead to the alleviation and help prevent  the relapse of depression.    5 Point Likert rating baseline date: 04/29/2021 Target Date Goal Was reviewed Status Code Progress towards goal/Likert rating  04/30/2023 04/28/2022               This plan has been reviewed and created by the following participants:  This plan will be reviewed at least every 12 months. Date Behavioral Health Clinician Date Guardian/Patient   04/28/2022  [name], [name]   04/28/2022 Joel Mayer                   [name], PhD

## 2022-05-12 ENCOUNTER — Ambulatory Visit: Payer: 59 | Admitting: Psychology

## 2022-05-26 ENCOUNTER — Ambulatory Visit (INDEPENDENT_AMBULATORY_CARE_PROVIDER_SITE_OTHER): Payer: 59 | Admitting: Psychology

## 2022-05-26 DIAGNOSIS — F411 Generalized anxiety disorder: Secondary | ICD-10-CM | POA: Diagnosis not present

## 2022-05-26 NOTE — Progress Notes (Signed)
[facility]  PROGRESS NOTE:  Name: Joel Mayer Date: 05/26/2022 MRN: [id number] DOB: 12-17-1975 PCP: Shon Baton, MD  Time spent: 8:00-8:55 AM   Annual Review: 04/30/2023  Today I met with  Joel Mayer in remote video (WebEx) face-to-face individual psychotherapy.   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   While he feels that things are better, he would like some assistance and moving forward.  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  He has been prescribed Prozac 60 mg 17 years.  He was in therapy with Dr. Rowe Robert until her retirement.  Dr. Rowe Robert referred Pt to this therapist.  Pt is married to [name] (45) married since September of '07.  This is a first marriage for both of them. They have two daughters together. Pt reports periodic conflicts with wife and need to be more involved with his daughters.  Mental Status Exam:  Appearance: Casual    Behavior: Appropriate Motor: Normal Speech/Language: Normal Rate Affect: Appropriate Mood: normal Thought process: normal Thought content: WNL Sensory/Perceptual disturbances: WNL Orientation: oriented to person Attention: Good Concentration: Good Memory: Immediate;   Good Fund of knowledge: Good Insight: Good Judgment: Good Impulse Control: Good  Risk Assessment: Danger to Self:  Yes.  with intent/plan Self-injurious Behavior: No Danger to Others: No Duty to Warn:no Physical Aggression / Violence:No  Access to Firearms a concern: No   Substance Abuse History: Current substance abuse: No     Past Psychiatric History:   Previous psychological history is significant for anxiety and depression Outpatient Providers: one previous course of therapy - Dr. Julius Bowels History of Psych  Hospitalization: No  Psychological Testing:  None    Living situation: the patient lives with their family  Sexual Orientation: Straight  Relationship Status: married  Name of spouse / other: Joel Mayer (1) If a parent, number of children / ages:  Joel Mayer ( 14) she is in a rising Joel Mayer (12) she is a rising Joel scientist (medical) at [employer]:  No   Income/Employment/Disability: Employment  Armed forces logistics/support/administrative officer: No   Educational History: Education: post Forensic psychologist work or degree  Religion/Sprituality/World View: Atheist  Any cultural differences that may affect / interfere with treatment:  not applicable   Recreation/Hobbies: reading, golf  Stressors: Marital or family conflict    Barriers:  None  Legal History: Pending legal issue / charges: The patient has no significant history of legal issues. History of legal issue / charges:  None     Individualized Treatment Plan       Strengths: Intelligent, resourceful, loyal, good problem solver, persistent, deals with adversity, friendly, humorous  Supports: spouse, friends (mostly out of town), mother   Goal/Needs for Treatment:  In order of importance to patient 1) Learn and implement mindfulness techniques for the purposes of relapse prevention. 2) Increased skills, effectiveness, and confidence in parenting 3) For interpersonal deficits, help the client develop new interpersonal skills and relationships.  4) Develop healthy interpersonal relationships that lead to the alleviation and help prevent  the relapse of depression.   Client Statement of Needs: Requires assistance of being more aware and mindful in his day to day life experience especially as his mood and behaviors impact his relationships with his wife and children.  A focus  on relapse prevention related to depression and maintenance, .   Treatment Level: Biweekly Individual Outpatient Psychotherapy  Symptoms:  History of chronic or recurrent  depression for which the client takes antidepressant medication, diminished interest in or enjoyment of activities, hypersensitivity to the criticism or disapproval of others, Reluctant involvement in social situations out of fear of saying or doing something foolish or of becoming emotional in front of others, reports difficulty in managing the challenging problem behavior of their child, social withdrawal. Displays deficits in parenting knowledge and skills and lacks knowledge regarding reasonable expectations for a child's behavior at a given developmental level.  Joel Mayer.  Client Treatment Preferences: continue treatment with current therapist   [name]:  Psychologist, [name], [name] will support the patient's ability to achieve the goals identified. Cognitive Behavioral Therapy, Dialectical Behavioral Therapy, Motivational Interviewing, behavior activation, parent training, and other evidenced-based practices will be used to promote progress towards healthy functioning.   [name] will: Actively participate in therapy, working towards healthy functioning.    *Justification for Continuation/Discontinuation of Goal: R=Revised, O=Ongoing, A=Achieved, D=Discontinued  Goal 1) Learn and implement mindfulness techniques for the purposes of relapse prevention. 5 Point Likert rating baseline date: 04/29/2021 Target Date Goal Was reviewed Status Code Progress towards goal/Likert rating  04/30/2023 04/28/2022          O 1/5 - pt has only a minimal level of the self awareness necessary to activate coping skills in a timely manner, unable to identify emotions             Goal 2) Increased skills, effectiveness, and confidence in parenting 5 Point Likert rating baseline date: 04/29/2021 Target Date Goal Was reviewed Status Code Progress towards goal/Likert rating  04/30/2023 04/28/2022          O 2/5 - increased awareness of children's  needs, increased level of attachment and engagement             Goal 3) For interpersonal deficits, help the client develop new interpersonal skills and relationships.  5 Point Likert rating baseline date: 04/29/2021 Target Date Goal Was reviewed Status Code Progress towards goal/Likert rating  04/30/2023 04/28/2022           O 1/5 - pt continues to avoid social opportunities, makes little effort to mobilize around goal to improve interpersonal interactions outside of immediate family              Goal 4) Develop healthy interpersonal relationships that lead to the alleviation and help prevent  the relapse of depression.    5 Point Likert rating baseline date: 04/29/2021 Target Date Goal Was reviewed Status Code Progress towards goal/Likert rating  04/30/2023 04/28/2022               This plan has been reviewed and created by the following participants:  This plan will be reviewed at least every 12 months. Date Behavioral Health Clinician Date Guardian/Patient   04/28/2022  [name], [name]   04/28/2022 Joel Mayer reports that he is having to find out how to manage with his mother now that his father is gone.  He still has many resentments about the way he was raised.  Having to deal with his aging mother has brought a lot to the surface.  We d/e/p his thoughts, feelings, memories and made connections  between the past and present.   [name], PhD

## 2022-06-09 ENCOUNTER — Ambulatory Visit (INDEPENDENT_AMBULATORY_CARE_PROVIDER_SITE_OTHER): Payer: 59 | Admitting: Psychology

## 2022-06-09 DIAGNOSIS — F4010 Social phobia, unspecified: Secondary | ICD-10-CM | POA: Diagnosis not present

## 2022-06-09 NOTE — Progress Notes (Signed)
[facility]  PROGRESS NOTE:  Name: Joel Mayer Date: 06/09/2022 MRN: [id number] DOB: 06-21-1976 PCP: Shon Baton, MD  Time spent: 8:00-8:55 AM   Annual Review: 04/30/2023  Today I met with  Joel Mayer in remote video (WebEx) face-to-face individual psychotherapy.   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   While he feels that things are better, he would like some assistance and moving forward.  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  He has been prescribed Prozac 60 mg 17 years.  He was in therapy with Dr. Rowe Robert until her retirement.  Dr. Rowe Robert referred Pt to this therapist.  Pt is married to Joel Mayer (45) married since September of '07.  This is a first marriage for both of them. They have two daughters together. Pt reports periodic conflicts with wife and need to be more involved with his daughters.  Mental Status Exam:  Appearance: Casual    Behavior: Appropriate Motor: Normal Speech/Language: Normal Rate Affect: Appropriate Mood: normal Thought process: normal Thought content: WNL Sensory/Perceptual disturbances: WNL Orientation: oriented to person Attention: Good Concentration: Good Memory: Immediate;   Good Fund of knowledge: Good Insight: Good Judgment: Good Impulse Control: Good  Risk Assessment: Danger to Self:  Yes.  with intent/plan Self-injurious Behavior: No Danger to Others: No Duty to Warn:no Physical Aggression / Violence:No  Access to Firearms a concern: No   Substance Abuse History: Current substance abuse: No     Past Psychiatric History:   Previous psychological history is significant for anxiety and depression Outpatient Providers: one previous course of therapy - Dr. Julius Bowels History of Psych  Hospitalization: No  Psychological Testing:  None    Living situation: the patient lives with their family  Sexual Orientation: Straight  Relationship Status: married  Name of spouse / other: Joel Mayer (34) If a parent, number of children / ages:  Joel Mayer ( 14) she is in a rising [place] Joel Mayer (12) she is a rising Research scientist (medical) at [employer]:  No   Income/Employment/Disability: Employment  Armed forces logistics/support/administrative officer: No   Educational History: Education: post Forensic psychologist work or degree  Religion/Sprituality/World View: Atheist  Any cultural differences that may affect / interfere with treatment:  not applicable   Recreation/Hobbies: reading, golf  Stressors: Marital or family conflict    Barriers:  None  Legal History: Pending legal issue / charges: The patient has no significant history of legal issues. History of legal issue / charges:  None     Individualized Treatment Plan       Strengths: Intelligent, resourceful, loyal, good problem solver, persistent, deals with adversity, friendly, humorous  Supports: spouse, friends (mostly out of town), mother   Goal/Needs for Treatment:  In order of importance to patient 1) Learn and implement mindfulness techniques for the purposes of relapse prevention. 2) Increased skills, effectiveness, and confidence in parenting 3) For interpersonal deficits, help the client develop new interpersonal skills and relationships.  4) Develop healthy interpersonal relationships that lead to the alleviation and help prevent  the relapse of depression.   Client Statement of Needs: Requires assistance of being more aware and mindful in his day to day life experience especially as his mood and behaviors impact his relationships with his wife and children.  A focus on relapse  prevention related to depression and maintenance, .   Treatment Level: Biweekly Individual Outpatient Psychotherapy  Symptoms:  History of chronic or recurrent  depression for which the client takes antidepressant medication, diminished interest in or enjoyment of activities, hypersensitivity to the criticism or disapproval of others, Reluctant involvement in social situations out of fear of saying or doing something foolish or of becoming emotional in front of others, reports difficulty in managing the challenging problem behavior of their child, social withdrawal. Displays deficits in parenting knowledge and skills and lacks knowledge regarding reasonable expectations for a child's behavior at a given developmental level.  [name].  Client Treatment Preferences: continue treatment with current therapist   [name]:  Psychologist, [name], [name] will support the patient's ability to achieve the goals identified. Cognitive Behavioral Therapy, Dialectical Behavioral Therapy, Motivational Interviewing, behavior activation, parent training, and other evidenced-based practices will be used to promote progress towards healthy functioning.   Healthcare consumer [name] will: Actively participate in therapy, working towards healthy functioning.    *Justification for Continuation/Discontinuation of Goal: R=Revised, O=Ongoing, A=Achieved, D=Discontinued  Goal 1) Learn and implement mindfulness techniques for the purposes of relapse prevention. 5 Point Likert rating baseline date: 04/29/2021 Target Date Goal Was reviewed Status Code Progress towards goal/Likert rating  04/30/2023 04/28/2022          O 1/5 - pt has only a minimal level of the self awareness necessary to activate coping skills in a timely manner, unable to identify emotions             Goal 2) Increased skills, effectiveness, and confidence in parenting 5 Point Likert rating baseline date: 04/29/2021 Target Date Goal Was reviewed Status Code Progress towards goal/Likert rating  04/30/2023 04/28/2022          O 2/5 - increased awareness of children's  needs, increased level of attachment and engagement             Goal 3) For interpersonal deficits, help the client develop new interpersonal skills and relationships.  5 Point Likert rating baseline date: 04/29/2021 Target Date Goal Was reviewed Status Code Progress towards goal/Likert rating  04/30/2023 04/28/2022           O 1/5 - pt continues to avoid social opportunities, makes little effort to mobilize around goal to improve interpersonal interactions outside of immediate family              Goal 4) Develop healthy interpersonal relationships that lead to the alleviation and help prevent  the relapse of depression.    5 Point Likert rating baseline date: 04/29/2021 Target Date Goal Was reviewed Status Code Progress towards goal/Likert rating  04/30/2023 04/28/2022               This plan has been reviewed and created by the following participants:  This plan will be reviewed at least every 12 months. Date Behavioral Health Clinician Date Guardian/Patient   04/28/2022  [name], [name]   04/28/2022 Garron "Joel Mayer" Marchello Rothgeb reports that he is having issues once again with a particular coworker.  He shared the background of their work history together.  We d/e/p the issues, its implications for Craig's job advancement and willingness to keep acting ethically.    Joel Mayer states that he has turned a corner in his home life.  He feels that he  is much more engaged and appreciative of what his wife accomplishes in a day.  We d/e the implications of this and identified that being mindful is always the goal.  We further d/ mindfulness in the context of trying to be more social as an anxious introvert.  By the end of the session, Joel Mayer had some practical ideas for how to take a different mindful approach to social situations.   [name], PhD

## 2022-06-23 ENCOUNTER — Ambulatory Visit (INDEPENDENT_AMBULATORY_CARE_PROVIDER_SITE_OTHER): Payer: 59 | Admitting: Psychology

## 2022-06-23 DIAGNOSIS — F3341 Major depressive disorder, recurrent, in partial remission: Secondary | ICD-10-CM | POA: Diagnosis not present

## 2022-06-23 NOTE — Progress Notes (Signed)
PROGRESS NOTE:  Name: Trystyn Dolley Date: 06/23/2022 MRN: [id number] DOB: 1976-08-07 PCP: Shon Baton, MD  Time spent: 8:00-8:55 AM   Annual Review: 04/30/2023  Today I met with  [name] in remote video (WebEx) face-to-face individual psychotherapy.   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   While he feels that things are better, he would like some assistance and moving forward.  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  He has been prescribed Prozac 60 mg 17 years.  He was in therapy with Dr. Rowe Robert until her retirement.  Dr. Rowe Robert referred Pt to this therapist.  Pt is married to Dobbins (44) married since September of '07.  This is a first marriage for both of them. They have two daughters together. Pt reports periodic conflicts with wife and need to be more involved with his daughters.  Mental Status Exam:  Appearance: Casual    Behavior: Appropriate Motor: Normal Speech/Language: Normal Rate Affect: Appropriate Mood: normal Thought process: normal Thought content: WNL Sensory/Perceptual disturbances: WNL Orientation: oriented to person Attention: Good Concentration: Good Memory: Immediate;   Good Fund of knowledge: Good Insight: Good Judgment: Good Impulse Control: Good  Risk Assessment: Danger to Self:  Yes.  with intent/plan Self-injurious Behavior: No Danger to Others: No Duty to Warn:no Physical Aggression / Violence:No  Access to Firearms a concern: No   Substance Abuse History: Current substance abuse: No     Past Psychiatric History:   Previous psychological history is significant for anxiety and depression Outpatient Providers: one previous course of therapy - Dr. Julius Bowels History of Psych Hospitalization: No   Psychological Testing:  None    Living situation: the patient lives with their family  Sexual Orientation: Straight  Relationship Status: married  Name of spouse / other: [name] (33) If a parent, number of children / ages:  [name] ( 14) she is in a rising [name] (12) she is a rising Research scientist (medical) at [employer]:  No   Income/Employment/Disability: Employment  Armed forces logistics/support/administrative officer: No   Educational History: Education: post Forensic psychologist work or degree  Religion/Sprituality/World View: Atheist  Any cultural differences that may affect / interfere with treatment:  not applicable   Recreation/Hobbies: reading, golf  Stressors: Marital or family conflict    Barriers:  None  Legal History: Pending legal issue / charges: The patient has no significant history of legal issues. History of legal issue / charges:  None     Individualized Treatment Plan       Strengths: Intelligent, resourceful, loyal, good problem solver, persistent, deals with adversity, friendly, humorous  Supports: spouse, friends (mostly out of town), mother   Goal/Needs for Treatment:  In order of importance to patient 1) Learn and implement mindfulness techniques for the purposes of relapse prevention. 2) Increased skills, effectiveness, and confidence in parenting 3) For interpersonal deficits, help the client develop new interpersonal skills and relationships.  4) Develop healthy interpersonal relationships that lead to the alleviation and help prevent  the relapse of depression.   Client Statement of Needs: Requires assistance of being more aware and mindful in his day to day life experience especially as his mood and behaviors impact his relationships with his wife and children.  A focus on relapse prevention related to depression  and maintenance, .   Treatment Level: Biweekly Individual Outpatient Psychotherapy  Symptoms:  History of chronic or recurrent depression for which  the client takes antidepressant medication, diminished interest in or enjoyment of activities, hypersensitivity to the criticism or disapproval of others, Reluctant involvement in social situations out of fear of saying or doing something foolish or of becoming emotional in front of others, reports difficulty in managing the challenging problem behavior of their child, social withdrawal. Displays deficits in parenting knowledge and skills and lacks knowledge regarding reasonable expectations for a child's behavior at a given developmental level.  [name].  Client Treatment Preferences: continue treatment with current therapist   [name]:  Psychologist, [name], [name] will support the patient's ability to achieve the goals identified. Cognitive Behavioral Therapy, Dialectical Behavioral Therapy, Motivational Interviewing, behavior activation, parent training, and other evidenced-based practices will be used to promote progress towards healthy functioning.   Healthcare consumer Juanangel "[name]" Renz will: Actively participate in therapy, working towards healthy functioning.    *Justification for Continuation/Discontinuation of Goal: R=Revised, O=Ongoing, A=Achieved, D=Discontinued  Goal 1) Learn and implement mindfulness techniques for the purposes of relapse prevention.  5 Point Likert rating baseline date: 04/29/2021 Target Date Goal Was reviewed Status Code Progress towards goal/Likert rating  04/30/2023 04/28/2022          O 1/5 - pt has only a minimal level of the self awareness necessary to activate coping skills in a timely manner, unable to identify emotions             Goal 2) Increased skills, effectiveness, and confidence in parenting  5 Point Likert rating baseline date: 04/29/2021 Target Date Goal Was reviewed Status Code Progress towards goal/Likert rating  04/30/2023 04/28/2022          O 2/5 - increased awareness of children's needs, increased  level of attachment and engagement             Goal 3) For interpersonal deficits, help the client develop new interpersonal skills and relationships.   5 Point Likert rating baseline date: 04/29/2021 Target Date Goal Was reviewed Status Code Progress towards goal/Likert rating  04/30/2023 04/28/2022           O 1/5 - pt continues to avoid social opportunities, makes little effort to mobilize around goal to improve interpersonal interactions outside of immediate family              Goal 4) Develop healthy interpersonal relationships that lead to the alleviation and help prevent  the relapse of depression.    5 Point Likert rating baseline date: 04/29/2021 Target Date Goal Was reviewed Status Code Progress towards goal/Likert rating  04/30/2023 04/28/2022          O 1/5 - pt continues to avoid social opportunities, makes little effort to mobilize around goal to improve interpersonal interactions outside of immediate family             This plan has been reviewed and created by the following participants:  This plan will be reviewed at least every 12 months. Date Behavioral Health Clinician Date Guardian/Patient   04/28/2022  [name], [name]   04/28/2022 Ashtian "[name]" Kovalcik                    In our last session, [name] reports that the coworker he was having difficulties with got fired.  He shared that the problem he identified earlier to management finally broke open.  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    [name] states that his daughter has been getting into their bed at night for several nights and it's never happened before.  We talked about how her daughter is about to start high school and is experiencing a great deal of anxiety.  We d/ how best to provide her with support, grow her confidence and better manage her anxiety.  I supported [name] idea of getting her re-engaged with therapy to help her in the  transition to high school.   [name], PhD

## 2022-07-07 ENCOUNTER — Ambulatory Visit (INDEPENDENT_AMBULATORY_CARE_PROVIDER_SITE_OTHER): Payer: 59 | Admitting: Psychology

## 2022-07-07 DIAGNOSIS — F3341 Major depressive disorder, recurrent, in partial remission: Secondary | ICD-10-CM | POA: Diagnosis not present

## 2022-07-07 NOTE — Progress Notes (Signed)
PROGRESS NOTE:  Name: Joel Mayer Date: 07/07/2022 MRN: [id number] DOB: 1976/02/02 PCP: Shon Baton, MD  Time spent: 8:00-8:55 AM   Annual Review: 04/30/2023  Today I met with  [name] in remote video (WebEx) face-to-face individual psychotherapy.   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   While he feels that things are better, he would like some assistance and moving forward.  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  He has been prescribed Prozac 60 mg 17 years.  He was in therapy with Dr. Rowe Robert until her retirement.  Dr. Rowe Robert referred Pt to this therapist.  Pt is married to Joel Mayer (45) married since September of '07.  This is a first marriage for both of them. They have two daughters together. Pt reports periodic conflicts with wife and need to be more involved with his daughters.  Mental Status Exam:  Appearance: Casual    Behavior: Appropriate Motor: Normal Speech/Language: Normal Rate Affect: Appropriate Mood: normal Thought process: normal Thought content: WNL Sensory/Perceptual disturbances: WNL Orientation: oriented to person Attention: Good Concentration: Good Memory: Immediate;   Good Fund of knowledge: Good Insight: Good Judgment: Good Impulse Control: Good  Risk Assessment: Danger to Self:  Yes.  with intent/plan Self-injurious Behavior: No Danger to Others: No Duty to Warn:no Physical Aggression / Violence:No  Access to Firearms a concern: No   Substance Abuse History: Current substance abuse: No     Past Psychiatric History:   Previous psychological history is significant for anxiety and depression Outpatient Providers: one previous course of therapy - Dr. Julius Bowels History of Psych Hospitalization: No  Psychological  Testing:  None    Living situation: the patient lives with their family  Sexual Orientation: Straight  Relationship Status: married  Name of spouse / other: Joel Mayer (34) If a parent, number of children / ages:  Joel Mayer ( 14) she is in a rising Joel Mayer (12) she is a rising Research scientist (medical) at [employer]:  No   Income/Employment/Disability: Employment  Armed forces logistics/support/administrative officer: No   Educational History: Education: post Forensic psychologist work or degree  Religion/Sprituality/World View: Atheist  Any cultural differences that may affect / interfere with treatment:  not applicable   Recreation/Hobbies: reading, golf  Stressors: Marital or family conflict    Barriers:  None  Legal History: Pending legal issue / charges: The patient has no significant history of legal issues. History of legal issue / charges:  None     Individualized Treatment Plan       Strengths: Intelligent, resourceful, loyal, good problem solver, persistent, deals with adversity, friendly, humorous  Supports: spouse, friends (mostly out of town), mother   Goal/Needs for Treatment:  In order of importance to patient 1) Learn and implement mindfulness techniques for the purposes of relapse prevention. 2) Increased skills, effectiveness, and confidence in parenting 3) For interpersonal deficits, help the client develop new interpersonal skills and relationships.  4) Develop healthy interpersonal relationships that lead to the alleviation and help prevent  the relapse of depression.   Client Statement of Needs: Requires assistance of being more aware and mindful in his day to day life experience especially as his mood and behaviors impact his relationships with his wife and children.  A focus on relapse prevention related to depression and  maintenance, .   Treatment Level: Biweekly Individual Outpatient Psychotherapy  Symptoms:  History of chronic or recurrent depression for which the client takes  antidepressant medication, diminished interest in or enjoyment of activities, hypersensitivity to the criticism or disapproval of others, Reluctant involvement in social situations out of fear of saying or doing something foolish or of becoming emotional in front of others, reports difficulty in managing the challenging problem behavior of their child, social withdrawal. Displays deficits in parenting knowledge and skills and lacks knowledge regarding reasonable expectations for a child's behavior at a given developmental level.  [name].  Client Treatment Preferences: continue treatment with current therapist   [name]:  Psychologist, [name], [name] will support the patient's ability to achieve the goals identified. Cognitive Behavioral Therapy, Dialectical Behavioral Therapy, Motivational Interviewing, behavior activation, parent training, and other evidenced-based practices will be used to promote progress towards healthy functioning.   Healthcare consumer Aiman "Joel Mayer" Ayre will: Actively participate in therapy, working towards healthy functioning.    *Justification for Continuation/Discontinuation of Goal: R=Revised, O=Ongoing, A=Achieved, D=Discontinued  Goal 1) Learn and implement mindfulness techniques for the purposes of relapse prevention.  5 Point Likert rating baseline date: 04/29/2021 Target Date Goal Was reviewed Status Code Progress towards goal/Likert rating  04/30/2023 04/28/2022          O 1/5 - pt has only a minimal level of the self awareness necessary to activate coping skills in a timely manner, unable to identify emotions             Goal 2) Increased skills, effectiveness, and confidence in parenting  5 Point Likert rating baseline date: 04/29/2021 Target Date Goal Was reviewed Status Code Progress towards goal/Likert rating  04/30/2023 04/28/2022          O 2/5 - increased awareness of children's needs, increased level of  attachment and engagement             Goal 3) For interpersonal deficits, help the client develop new interpersonal skills and relationships.   5 Point Likert rating baseline date: 04/29/2021 Target Date Goal Was reviewed Status Code Progress towards goal/Likert rating  04/30/2023 04/28/2022           O 1/5 - pt continues to avoid social opportunities, makes little effort to mobilize around goal to improve interpersonal interactions outside of immediate family              Goal 4) Develop healthy interpersonal relationships that lead to the alleviation and help prevent  the relapse of depression.    5 Point Likert rating baseline date: 04/29/2021 Target Date Goal Was reviewed Status Code Progress towards goal/Likert rating  04/30/2023 04/28/2022          O 1/5 - pt continues to avoid social opportunities, makes little effort to mobilize around goal to improve interpersonal interactions outside of immediate family             This plan has been reviewed and created by the following participants:  This plan will be reviewed at least every 12 months. Date Behavioral Health Clinician Date Guardian/Patient   04/28/2022  [name], [name]   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  We talked about the many ways that his work  on a day to day business has changed.  We d/ opportunities that will open up as a result of a recent work acquisition.  I encouraged him to meet with and seek the advise of a financial advisor.  Joel Mayer admits that he gets overwhelmed when he tries to make sense of his family's personal finances.  We agreed that it would be beneficial for him to seek out the services of an account who could help him get a clearer picture of his financial situation.  Having this information will help him to begin to make decision about future  investments.    [name], PhD

## 2022-07-21 ENCOUNTER — Ambulatory Visit (INDEPENDENT_AMBULATORY_CARE_PROVIDER_SITE_OTHER): Payer: 59 | Admitting: Psychology

## 2022-07-21 DIAGNOSIS — F411 Generalized anxiety disorder: Secondary | ICD-10-CM | POA: Diagnosis not present

## 2022-07-21 NOTE — Progress Notes (Signed)
PROGRESS NOTE:  Name: Joel Mayer Date: 07/21/2022 MRN: [id number] DOB: 1976-10-10 PCP: Shon Baton, MD  Time spent: 8:00-8:55 AM   Annual Review: 04/30/2023  Today I met with  Joel Mayer in remote video (WebEx) face-to-face individual psychotherapy.   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   While he feels that things are better, he would like some assistance and moving forward.  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  He has been prescribed Prozac 60 mg 17 years.  He was in therapy with Dr. Rowe Robert until her retirement.  Dr. Rowe Robert referred Pt to this therapist.  Pt is married to [name] (45) married since September of '07.  This is a first marriage for both of them. They have two daughters together. Pt reports periodic conflicts with wife and need to be more involved with his daughters.  Mental Status Exam:  Appearance: Casual    Behavior: Appropriate Motor: Normal Speech/Language: Normal Rate Affect: Appropriate Mood: normal Thought process: normal Thought content: WNL Sensory/Perceptual disturbances: WNL Orientation: oriented to person Attention: Good Concentration: Good Memory: Immediate;   Good Fund of knowledge: Good Insight: Good Judgment: Good Impulse Control: Good  Risk Assessment: Danger to Self:  Yes.  with intent/plan Self-injurious Behavior: No Danger to Others: No Duty to Warn:no Physical Aggression / Violence:No  Access to Firearms a concern: No   Substance Abuse History: Current substance abuse: No     Past Psychiatric History:   Previous psychological history is significant for anxiety and depression Outpatient Providers: one previous course of therapy - Dr. Julius Bowels History of Psych Hospitalization: No  Psychological  Testing:  None    Living situation: the patient lives with their family  Sexual Orientation: Straight  Relationship Status: married  Name of spouse / other: Joel Mayer (19) If a parent, number of children / ages:  Joel Mayer ( 14) she is in a rising Joel Mayer (12) she is a rising Research scientist (medical) at [employer]:  No   Income/Employment/Disability: Employment  Armed forces logistics/support/administrative officer: No   Educational History: Education: post Forensic psychologist work or degree  Religion/Sprituality/World View: Atheist  Any cultural differences that may affect / interfere with treatment:  not applicable   Recreation/Hobbies: reading, golf  Stressors: Marital or family conflict    Barriers:  None  Legal History: Pending legal issue / charges: The patient has no significant history of legal issues. History of legal issue / charges:  None     Individualized Treatment Plan       Strengths: Intelligent, resourceful, loyal, good problem solver, persistent, deals with adversity, friendly, humorous  Supports: spouse, friends (mostly out of town), mother   Goal/Needs for Treatment:  In order of importance to patient 1) Learn and implement mindfulness techniques for the purposes of relapse prevention. 2) Increased skills, effectiveness, and confidence in parenting 3) For interpersonal deficits, help the client develop new interpersonal skills and relationships.  4) Develop healthy interpersonal relationships that lead to the alleviation and help prevent  the relapse of depression.   Client Statement of Needs: Requires assistance of being more aware and mindful in his day to day life experience especially as his mood and behaviors impact his relationships with his wife and children.  A focus on relapse prevention related to depression and  maintenance, .   Treatment Level: Biweekly Individual Outpatient Psychotherapy  Symptoms:  History of chronic or recurrent depression for which the client takes  antidepressant medication, diminished interest in or enjoyment of activities, hypersensitivity to the criticism or disapproval of others, Reluctant involvement in social situations out of fear of saying or doing something foolish or of becoming emotional in front of others, reports difficulty in managing the challenging problem behavior of their child, social withdrawal. Displays deficits in parenting knowledge and skills and lacks knowledge regarding reasonable expectations for a child's behavior at a given developmental level.  [name].  Client Treatment Preferences: continue treatment with current therapist   [name]:  Psychologist, [name], [name] will support the patient's ability to achieve the goals identified. Cognitive Behavioral Therapy, Dialectical Behavioral Therapy, Motivational Interviewing, behavior activation, parent training, and other evidenced-based practices will be used to promote progress towards healthy functioning.   [name] "Joel Mayer" [name] will: Actively participate in therapy, working towards healthy functioning.    *Justification for Continuation/Discontinuation of Goal: R=Revised, O=Ongoing, A=Achieved, D=Discontinued  Goal 1) Learn and implement mindfulness techniques for the purposes of relapse prevention.  5 Point Likert rating baseline date: 04/29/2021 Target Date Goal Was reviewed Status Code Progress towards goal/Likert rating  04/30/2023 04/28/2022          O 1/5 - pt has only a minimal level of the self awareness necessary to activate coping skills in a timely manner, unable to identify emotions             Goal 2) Increased skills, effectiveness, and confidence in parenting  5 Point Likert rating baseline date: 04/29/2021 Target Date Goal Was reviewed Status Code Progress towards goal/Likert rating  04/30/2023 04/28/2022          O 2/5 - increased awareness of children's needs, increased level of  attachment and engagement             Goal 3) For interpersonal deficits, help the client develop new interpersonal skills and relationships.   5 Point Likert rating baseline date: 04/29/2021 Target Date Goal Was reviewed Status Code Progress towards goal/Likert rating  04/30/2023 04/28/2022           O 1/5 - pt continues to avoid social opportunities, makes little effort to mobilize around goal to improve interpersonal interactions outside of immediate family              Goal 4) Develop healthy interpersonal relationships that lead to the alleviation and help prevent  the relapse of depression.    5 Point Likert rating baseline date: 04/29/2021 Target Date Goal Was reviewed Status Code Progress towards goal/Likert rating  04/30/2023 04/28/2022          O 1/5 - pt continues to avoid social opportunities, makes little effort to mobilize around goal to improve interpersonal interactions outside of immediate family             This plan has been reviewed and created by the following participants:  This plan will be reviewed at least every 12 months. Date Behavioral Health Clinician Date Guardian/Patient   04/28/2022  [name], [name]   04/28/2022 Joel Mayer "Joel Mayer" Bodin Gorka reports that he followed up with his wife and is moving forward with finding an accountant who could help him with getting the bigger picture of his finances.  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  He shared some of the stresses and excitement of heading up this project.  I noted that although he has been working a great deal of overtime, he seems to be enjoying himself.  And if anything, he is more content with work than he has been is ages.  With this comment I opened up a d/e about how he might ask for more of what makes him happy at work.  I asked him to reflect on what he sees as the next move at work that would make him happy.  I  encouraged him to reflect and journal so that he might take a more active (vs passively waiting for something to happen as did here) part in his happiness.    [name], PhD

## 2022-08-04 ENCOUNTER — Ambulatory Visit (INDEPENDENT_AMBULATORY_CARE_PROVIDER_SITE_OTHER): Payer: 59 | Admitting: Psychology

## 2022-08-04 DIAGNOSIS — F3341 Major depressive disorder, recurrent, in partial remission: Secondary | ICD-10-CM

## 2022-08-04 NOTE — Progress Notes (Signed)
PROGRESS NOTE:  Name: Joel Mayer Date: 08/04/2022 MRN: [id number] DOB: September 12, 1976 PCP: Shon Baton, MD  Time spent: 8:00-8:55 AM   Annual Review: 04/30/2023  Today I met with  [name] in remote video (WebEx) face-to-face individual psychotherapy.   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   While he feels that things are better, he would like some assistance and moving forward.  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  He has been prescribed Prozac 60 mg 17 years.  He was in therapy with Dr. Rowe Robert until her retirement.  Dr. Rowe Robert referred Pt to this therapist.  Pt is married to Joel Mayer (45) married since September of '07.  This is a first marriage for both of them. They have two daughters together. Pt reports periodic conflicts with wife and need to be more involved with his daughters.  Mental Status Exam:  Appearance: Casual    Behavior: Appropriate Motor: Normal Speech/Language: Normal Rate Affect: Appropriate Mood: normal Thought process: normal Thought content: WNL Sensory/Perceptual disturbances: WNL Orientation: oriented to person Attention: Good Concentration: Good Memory: Immediate;   Good Fund of knowledge: Good Insight: Good Judgment: Good Impulse Control: Good  Risk Assessment: Danger to Self:  Yes.  with intent/plan Self-injurious Behavior: No Danger to Others: No Duty to Warn:no Physical Aggression / Violence:No  Access to Firearms a concern: No   Substance Abuse History: Current substance abuse: No     Past Psychiatric History:   Previous psychological history is significant for anxiety and depression Outpatient Providers: one previous course of therapy - Dr. Julius Bowels History of Psych Hospitalization: No  Psychological  Testing:  None    Living situation: the patient lives with their family  Sexual Orientation: Straight  Relationship Status: married  Name of spouse / other: Joel Mayer (29) If a parent, number of children / ages:  [name] ( 14) she is in a rising [name] (12) she is a rising Research scientist (medical) at [name]:  No   Income/Employment/Disability: Employment  Armed forces logistics/support/administrative officer: No   Educational History: Education: post Forensic psychologist work or degree  Religion/Sprituality/World View: Atheist  Any cultural differences that may affect / interfere with treatment:  not applicable   Recreation/Hobbies: reading, golf  Stressors: Marital or family conflict    Barriers:  None  Legal History: Pending legal issue / charges: The patient has no significant history of legal issues. History of legal issue / charges:  None     Individualized Treatment Plan       Strengths: Intelligent, resourceful, loyal, good problem solver, persistent, deals with adversity, friendly, humorous  Supports: spouse, friends (mostly out of town), mother   Goal/Needs for Treatment:  In order of importance to patient 1) Learn and implement mindfulness techniques for the purposes of relapse prevention. 2) Increased skills, effectiveness, and confidence in parenting 3) For interpersonal deficits, help the client develop new interpersonal skills and relationships.  4) Develop healthy interpersonal relationships that lead to the alleviation and help prevent  the relapse of depression.   Client Statement of Needs: Requires assistance of being more aware and mindful in his day to day life experience especially as his mood and behaviors impact his relationships with his wife and children.  A focus on relapse prevention related to depression and maintenance, .  Treatment Level: Biweekly Individual Outpatient Psychotherapy  Symptoms:  History of chronic or recurrent depression for which the client takes  antidepressant medication, diminished interest in or enjoyment of activities, hypersensitivity to the criticism or disapproval of others, Reluctant involvement in social situations out of fear of saying or doing something foolish or of becoming emotional in front of others, reports difficulty in managing the challenging problem behavior of their child, social withdrawal. Displays deficits in parenting knowledge and skills and lacks knowledge regarding reasonable expectations for a child's behavior at a given developmental level.  [name].  Client Treatment Preferences: continue treatment with current therapist   [name]:  Psychologist, [name], [name] will support the patient's ability to achieve the goals identified. Cognitive Behavioral Therapy, Dialectical Behavioral Therapy, Motivational Interviewing, behavior activation, parent training, and other evidenced-based practices will be used to promote progress towards healthy functioning.   Healthcare consumer [name] "Joel Mayer" Guitron will: Actively participate in therapy, working towards healthy functioning.    *Justification for Continuation/Discontinuation of Goal: R=Revised, O=Ongoing, A=Achieved, D=Discontinued  Goal 1) Learn and implement mindfulness techniques for the purposes of relapse prevention.  5 Point Likert rating baseline date: 04/29/2021 Target Date Goal Was reviewed Status Code Progress towards goal/Likert rating  04/30/2023 04/28/2022          O 1/5 - pt has only a minimal level of the self awareness necessary to activate coping skills in a timely manner, unable to identify emotions             Goal 2) Increased skills, effectiveness, and confidence in parenting  5 Point Likert rating baseline date: 04/29/2021 Target Date Goal Was reviewed Status Code Progress towards goal/Likert rating  04/30/2023 04/28/2022          O 2/5 - increased awareness of children's needs, increased level of  attachment and engagement             Goal 3) For interpersonal deficits, help the client develop new interpersonal skills and relationships.   5 Point Likert rating baseline date: 04/29/2021 Target Date Goal Was reviewed Status Code Progress towards goal/Likert rating  04/30/2023 04/28/2022           O 1/5 - pt continues to avoid social opportunities, makes little effort to mobilize around goal to improve interpersonal interactions outside of immediate family              Goal 4) Develop healthy interpersonal relationships that lead to the alleviation and help prevent  the relapse of depression.    5 Point Likert rating baseline date: 04/29/2021 Target Date Goal Was reviewed Status Code Progress towards goal/Likert rating  04/30/2023 04/28/2022          O 1/5 - pt continues to avoid social opportunities, makes little effort to mobilize around goal to improve interpersonal interactions outside of immediate family             This plan has been reviewed and created by the following participants:  This plan will be reviewed at least every 12 months. Date Behavioral Health Clinician Date Guardian/Patient   04/28/2022  [name], [name]   04/28/2022 Nickolaos "Joel Mayer" Philopateer Strine reports that he spent some time with some of the parents in the neighborhood.  He was able to talk and feel like he had things in common with them but it was mostly  around their kids.  In session, we talked about raising girls, navigating high school, social pressures and dating.  I encouraged him to     [name], PhD   ========================================  Wife: Joel Mayer Daughters:  [name] - [name] - 7th Grade

## 2022-08-18 ENCOUNTER — Ambulatory Visit (INDEPENDENT_AMBULATORY_CARE_PROVIDER_SITE_OTHER): Payer: 59 | Admitting: Psychology

## 2022-08-18 DIAGNOSIS — F3341 Major depressive disorder, recurrent, in partial remission: Secondary | ICD-10-CM | POA: Diagnosis not present

## 2022-08-18 NOTE — Progress Notes (Signed)
PROGRESS NOTE:  Name: Joel Mayer Date: 08/18/2022 MRN: [id number] DOB: 1976/03/24 PCP: Shon Baton, MD  Time spent: 8:00-8:55 AM   Annual Review: 04/30/2023  Today I met with  Joel Mayer in remote video (WebEx) face-to-face individual psychotherapy.   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   While he feels that things are better, he would like some assistance and moving forward.  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  He has been prescribed Prozac 60 mg 17 years.  He was in therapy with Dr. Rowe Robert until her retirement.  Dr. Rowe Robert referred Pt to this therapist.  Pt is married to Granby (45) married since September of '07.  This is a first marriage for both of them. They have two daughters together. Pt reports periodic conflicts with wife and need to be more involved with his daughters.  Mental Status Exam:  Appearance: Casual    Behavior: Appropriate Motor: Normal Speech/Language: Normal Rate Affect: Appropriate Mood: normal Thought process: normal Thought content: WNL Sensory/Perceptual disturbances: WNL Orientation: oriented to person Attention: Good Concentration: Good Memory: Immediate;   Good Fund of knowledge: Good Insight: Good Judgment: Good Impulse Control: Good  Risk Assessment: Danger to Self:  Yes.  with intent/plan Self-injurious Behavior: No Danger to Others: No Duty to Warn:no Physical Aggression / Violence:No  Access to Firearms a concern: No   Substance Abuse History: Current substance abuse: No     Past Psychiatric History:   Previous psychological history is significant for anxiety and depression Outpatient Providers: one previous course of therapy - Dr. Julius Bowels History of Psych Hospitalization: No  Psychological  Testing:  None    Living situation: the patient lives with their family  Sexual Orientation: Straight  Relationship Status: married  Name of spouse / other: [name] (46) If a parent, number of children / ages:  [name] ( 14) she is in a rising [name] (12) she is a rising Research scientist (medical) at [employer]:  No   Income/Employment/Disability: Employment  Armed forces logistics/support/administrative officer: No   Educational History: Education: post Forensic psychologist work or degree  Religion/Sprituality/World View: Atheist  Any cultural differences that may affect / interfere with treatment:  not applicable   Recreation/Hobbies: reading, golf  Stressors: Marital or family conflict    Barriers:  None  Legal History: Pending legal issue / charges: The patient has no significant history of legal issues. History of legal issue / charges:  None  Individualized Treatment Plan                Strengths: Intelligent, resourceful, loyal, good problem solver, persistent, deals with adversity, friendly, humorous  Supports: spouse, friends (mostly out of town), mother   Goal/Needs for Treatment:  In order of importance to patient 1) Learn and implement mindfulness techniques for the purposes of relapse prevention. 2) Increased skills, effectiveness, and confidence in parenting 3) For interpersonal deficits, help the client develop new interpersonal skills and relationships.  4) Develop healthy interpersonal relationships that lead to the alleviation and help prevent  the relapse of depression.   Client Statement of Needs: Requires assistance of being more aware and mindful in his day to day life experience especially as his mood and behaviors impact his relationships with his wife and children.  A focus on relapse  prevention related to depression and maintenance, .   Treatment Level: Biweekly Individual Outpatient Psychotherapy  Symptoms:  History of chronic or recurrent depression for which the client  takes antidepressant medication, diminished interest in or enjoyment of activities, hypersensitivity to the criticism or disapproval of others, Reluctant involvement in social situations out of fear of saying or doing something foolish or of becoming emotional in front of others, reports difficulty in managing the challenging problem behavior of their child, social withdrawal. Displays deficits in parenting knowledge and skills and lacks knowledge regarding reasonable expectations for a child's behavior at a given developmental level.  [name].  Client Treatment Preferences: continue treatment with current therapist   [name]:  Psychologist, [name], [name] will support the patient's ability to achieve the goals identified. Cognitive Behavioral Therapy, Dialectical Behavioral Therapy, Motivational Interviewing, behavior activation, parent training, and other evidenced-based practices will be used to promote progress towards healthy functioning.   [name] will: Actively participate in therapy, working towards healthy functioning.    *Justification for Continuation/Discontinuation of Goal: R=Revised, O=Ongoing, A=Achieved, D=Discontinued  Goal 1) Learn and implement mindfulness techniques for the purposes of relapse prevention.  5 Point Likert rating baseline date: 04/29/2021 Target Date Goal Was reviewed Status Code Progress towards goal/Likert rating  04/30/2023 04/28/2022          O 1/5 - pt has only a minimal level of the self awareness necessary to activate coping skills in a timely manner, unable to identify emotions             Goal 2) Increased skills, effectiveness, and confidence in parenting  5 Point Likert rating baseline date: 04/29/2021 Target Date Goal Was reviewed Status Code Progress towards goal/Likert rating  04/30/2023 04/28/2022          O 2/5 - increased awareness of children's needs, increased level of  attachment and engagement             Goal 3) For interpersonal deficits, help the client develop new interpersonal skills and relationships.   5 Point Likert rating baseline date: 04/29/2021 Target Date Goal Was reviewed Status Code Progress towards goal/Likert rating  04/30/2023 04/28/2022           O 1/5 - pt continues to avoid social opportunities, makes little effort to mobilize around goal to improve interpersonal interactions outside of immediate family              Goal 4) Develop healthy interpersonal relationships that lead to the alleviation and help prevent  the relapse of depression.    5 Point Likert rating baseline date: 04/29/2021 Target Date Goal Was reviewed Status Code Progress towards goal/Likert rating  04/30/2023 04/28/2022          O 1/5 - pt continues to avoid social opportunities, makes little effort to mobilize around goal to improve interpersonal interactions outside of immediate family             This plan has been reviewed and created by the following participants:  This plan will be reviewed at least every 12 months. Date Behavioral Health Clinician Date Guardian/Patient   04/28/2022  [name], [name]   04/28/2022 Joel Mayer reports that over the course of these two weeks, both of the girls snuck out of the house at night.  We d/e/p how they  understood their daughters' behavior, the consequences they placed on her and concerns for the future.  Joel Mayer states that his daughters' behavior had him reflecting on his own pre-teen and teen years and we related current events to his own experiences.  Lastly, we began a discussion about how his role as a father will change as he learns to parent teenagers.   [name], PhD   ========================================  Wife: [name] Daughters:  [name] - [name] - 7th Grade

## 2022-09-01 ENCOUNTER — Ambulatory Visit (INDEPENDENT_AMBULATORY_CARE_PROVIDER_SITE_OTHER): Payer: 59 | Admitting: Psychology

## 2022-09-01 DIAGNOSIS — F3341 Major depressive disorder, recurrent, in partial remission: Secondary | ICD-10-CM | POA: Diagnosis not present

## 2022-09-01 NOTE — Progress Notes (Signed)
PROGRESS NOTE:  Name: Joel Mayer Date: 09/01/2022 MRN: [id number] DOB: 1976/09/27 PCP: Shon Baton, MD  Time spent: 8:00-8:55 AM   Annual Review: 04/30/2023  Today I met with  Joel Mayer in remote video (WebEx) face-to-face individual psychotherapy.   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   While he feels that things are better, he would like some assistance and moving forward.  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  He has been prescribed Prozac 60 mg 17 years.  He was in therapy with Dr. Rowe Robert until her retirement.  Dr. Rowe Robert referred Pt to this therapist.  Pt is married to Joel Mayer (45) married since September of '07.  This is a first marriage for both of them. They have two daughters together. Pt reports periodic conflicts with wife and need to be more involved with his daughters.  Mental Status Exam:  Appearance: Casual    Behavior: Appropriate Motor: Normal Speech/Language: Normal Rate Affect: Appropriate Mood: normal Thought process: normal Thought content: WNL Sensory/Perceptual disturbances: WNL Orientation: oriented to person Attention: Good Concentration: Good Memory: Immediate;   Good Fund of knowledge: Good Insight: Good Judgment: Good Impulse Control: Good  Risk Assessment: Danger to Self:  Yes.  with intent/plan Self-injurious Behavior: No Danger to Others: No Duty to Warn:no Physical Aggression / Violence:No  Access to Firearms a concern: No   Substance Abuse History: Current substance abuse: No     Past Psychiatric History:   Previous psychological history is significant for anxiety and depression Outpatient Providers: one previous course of therapy - Dr. Julius Bowels History of Psych Hospitalization: No  Psychological  Testing:  None    Living situation: the patient lives with their family  Sexual Orientation: Straight  Relationship Status: married  Name of spouse / other: Joel Mayer (36) If a parent, number of children / ages:  Joel Mayer ( 14) she is in a rising Joel Mayer (12) she is a rising Research scientist (medical) at Joel Mayer:  No   Income/Employment/Disability: Employment  Armed forces logistics/support/administrative officer: No   Educational History: Education: post Forensic psychologist work or degree  Religion/Sprituality/World View: Atheist  Any cultural differences that may affect / interfere with treatment:  not applicable   Recreation/Hobbies: reading, golf  Stressors: Marital or family conflict    Barriers:  None  Legal History: Pending legal issue / charges: The patient has no significant history of legal issues. History of legal issue / charges:  None  Individualized Treatment Plan                Strengths: Intelligent, resourceful, loyal, good problem solver, persistent, deals with adversity, friendly, humorous  Supports: spouse, friends (mostly out of town), mother   Goal/Needs for Treatment:  In order of importance to patient 1) Learn and implement mindfulness techniques for the purposes of relapse prevention. 2) Increased skills, effectiveness, and confidence in parenting 3) For interpersonal deficits, help the client develop new interpersonal skills and relationships.  4) Develop healthy interpersonal relationships that lead to the alleviation and help prevent  the relapse of depression.   Client Statement of Needs: Requires assistance of being more aware and mindful in his day to day life experience especially as his mood and behaviors impact his relationships with his wife and children.  A focus on relapse  prevention related to depression and maintenance, .   Treatment Level: Biweekly Individual Outpatient Psychotherapy  Symptoms:  History of chronic or recurrent depression for which the client  takes antidepressant medication, diminished interest in or enjoyment of activities, hypersensitivity to the criticism or disapproval of others, Reluctant involvement in social situations out of fear of saying or doing something foolish or of becoming emotional in front of others, reports difficulty in managing the challenging problem behavior of their child, social withdrawal. Displays deficits in parenting knowledge and skills and lacks knowledge regarding reasonable expectations for a child's behavior at a given developmental level.  [name].  Client Treatment Preferences: continue treatment with current therapist   [name]:  Psychologist, [name], [name] will support the patient's ability to achieve the goals identified. Cognitive Behavioral Therapy, Dialectical Behavioral Therapy, Motivational Interviewing, behavior activation, parent training, and other evidenced-based practices will be used to promote progress towards healthy functioning.   [name] will: Actively participate in therapy, working towards healthy functioning.    *Justification for Continuation/Discontinuation of Goal: R=Revised, O=Ongoing, A=Achieved, D=Discontinued  Goal 1) Learn and implement mindfulness techniques for the purposes of relapse prevention.  5 Point Likert rating baseline date: 04/29/2021 Target Date Goal Was reviewed Status Code Progress towards goal/Likert rating  04/30/2023 04/28/2022          O 1/5 - pt has only a minimal level of the self awareness necessary to activate coping skills in a timely manner, unable to identify emotions             Goal 2) Increased skills, effectiveness, and confidence in parenting  5 Point Likert rating baseline date: 04/29/2021 Target Date Goal Was reviewed Status Code Progress towards goal/Likert rating  04/30/2023 04/28/2022          O 2/5 - increased awareness of children's needs, increased level of  attachment and engagement             Goal 3) For interpersonal deficits, help the client develop new interpersonal skills and relationships.   5 Point Likert rating baseline date: 04/29/2021 Target Date Goal Was reviewed Status Code Progress towards goal/Likert rating  04/30/2023 04/28/2022           O 1/5 - pt continues to avoid social opportunities, makes little effort to mobilize around goal to improve interpersonal interactions outside of immediate family              Goal 4) Develop healthy interpersonal relationships that lead to the alleviation and help prevent  the relapse of depression.    5 Point Likert rating baseline date: 04/29/2021 Target Date Goal Was reviewed Status Code Progress towards goal/Likert rating  04/30/2023 04/28/2022          O 1/5 - pt continues to avoid social opportunities, makes little effort to mobilize around goal to improve interpersonal interactions outside of immediate family             This plan has been reviewed and created by the following participants:  This plan will be reviewed at least every 12 months. Date Behavioral Health Clinician Date Guardian/Patient   04/28/2022  [name], [name]   04/28/2022 Joel Mayer reports that his girls are having some difficult social dynamics to deal with their social groups.  Joel Mayer talked about all that's occurred, how badly  he and Joel Mayer felt and how they responded.  I offered some additional suggestions.  I encouraged Joel Mayer to read the book Untangled to help him have some understanding of the developmental tasks his daughters are working through and to help him remain engage and not defer to his wife.   [name], PhD   ========================================  Wife: Joel Mayer Daughters:  Maretta Bees - Freshman @ [employer] - 7th Grade @ [employer]

## 2022-09-15 ENCOUNTER — Ambulatory Visit: Payer: 59 | Admitting: Psychology

## 2022-09-29 ENCOUNTER — Ambulatory Visit (INDEPENDENT_AMBULATORY_CARE_PROVIDER_SITE_OTHER): Payer: 59 | Admitting: Psychology

## 2022-09-29 DIAGNOSIS — F3341 Major depressive disorder, recurrent, in partial remission: Secondary | ICD-10-CM

## 2022-09-29 NOTE — Progress Notes (Signed)
PROGRESS NOTE:  Name: Joel Mayer Date: 09/29/2022 MRN: [id number] DOB: 1975/12/12 PCP: Shon Baton, MD  Time spent: 8:00-8:55 AM   Annual Review: 04/30/2023  Today I met with  Joel Mayer in remote video (WebEx) face-to-face individual psychotherapy.   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   While he feels that things are better, he would like some assistance and moving forward.  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  He has been prescribed Prozac 60 mg 17 years.  He was in therapy with Dr. Rowe Mayer until her retirement.  Dr. Rowe Mayer referred Pt to this therapist.  Pt is married to Joel Mayer (44) married since September of '07.  This is a first marriage for both of them. They have two daughters together. Pt reports periodic conflicts with wife and need to be more involved with his daughters.  Mental Status Exam:  Appearance: Casual    Behavior: Appropriate Motor: Normal Speech/Language: Normal Rate Affect: Appropriate Mood: normal Thought process: normal Thought content: WNL Sensory/Perceptual disturbances: WNL Orientation: oriented to person Attention: Good Concentration: Good Memory: Immediate;   Good Fund of knowledge: Good Insight: Good Judgment: Good Impulse Control: Good  Risk Assessment: Danger to Self:  Yes.  with intent/plan Self-injurious Behavior: No Danger to Others: No Duty to Warn:no Physical Aggression / Violence:No  Access to Firearms a concern: No   Substance Abuse History: Current substance abuse: No     Past Psychiatric History:   Previous psychological history is significant for anxiety and depression Outpatient Providers: one previous course of therapy - Dr. Julius Mayer History of Psych Hospitalization: No  Psychological  Testing:  None    Living situation: the patient lives with their family  Sexual Orientation: Straight  Relationship Status: married  Name of spouse / other: Joel Mayer (21) If a parent, number of children / ages:  Joel Mayer ( 14) she is in a rising Joel Mayer (12) she is a rising Joel scientist (medical) at [employer]:  No   Income/Employment/Disability: Employment  Armed forces logistics/support/administrative officer: No   Educational History: Education: post Forensic psychologist work or degree  Religion/Sprituality/World View: Atheist  Any cultural differences that may affect / interfere with treatment:  not applicable   Recreation/Hobbies: reading, golf  Stressors: Marital or family conflict    Barriers:  None  Legal History: Pending legal issue / charges: The patient has no significant history of legal issues. History of legal issue / charges:  None  Individualized Treatment Plan                Strengths: Intelligent, resourceful, loyal, good problem solver, persistent, deals with adversity, friendly, humorous  Supports: spouse, friends (mostly out of town), mother   Goal/Needs for Treatment:  In order of importance to patient 1) Learn and implement mindfulness techniques for the purposes of relapse prevention. 2) Increased skills, effectiveness, and confidence in parenting 3) For interpersonal deficits, help the client develop new interpersonal skills and relationships.  4) Develop healthy interpersonal relationships that lead to the alleviation and help prevent  the relapse of depression.   Client Statement of Needs: Requires assistance of being more aware and mindful in his day to day life experience especially as his mood and behaviors impact his relationships with his wife and children.  A focus on relapse  prevention related to depression and maintenance, .   Treatment Level: Biweekly Individual Outpatient Psychotherapy  Symptoms:  History of chronic or recurrent depression for which the client  takes antidepressant medication, diminished interest in or enjoyment of activities, hypersensitivity to the criticism or disapproval of others, Reluctant involvement in social situations out of fear of saying or doing something foolish or of becoming emotional in front of others, reports difficulty in managing the challenging problem behavior of their child, social withdrawal. Displays deficits in parenting knowledge and skills and lacks knowledge regarding reasonable expectations for a child's behavior at a given developmental level.  Joel Mayer.  Client Treatment Preferences: continue treatment with current therapist   [name]:  Psychologist, Joel Mayer, [name] will support the patient's ability to achieve the goals identified. Cognitive Behavioral Therapy, Dialectical Behavioral Therapy, Motivational Interviewing, behavior activation, parent training, and other evidenced-based practices will be used to promote progress towards healthy functioning.   [name] will: Actively participate in therapy, working towards healthy functioning.    *Justification for Continuation/Discontinuation of Goal: R=Revised, O=Ongoing, A=Achieved, D=Discontinued  Goal 1) Learn and implement mindfulness techniques for the purposes of relapse prevention.  5 Point Likert rating baseline date: 04/29/2021 Target Date Goal Was reviewed Status Code Progress towards goal/Likert rating  04/30/2023 04/28/2022          O 1/5 - pt has only a minimal level of the self awareness necessary to activate coping skills in a timely manner, unable to identify emotions             Goal 2) Increased skills, effectiveness, and confidence in parenting  5 Point Likert rating baseline date: 04/29/2021 Target Date Goal Was reviewed Status Code Progress towards goal/Likert rating  04/30/2023 04/28/2022          O 2/5 - increased awareness of children's needs, increased level of  attachment and engagement             Goal 3) For interpersonal deficits, help the client develop new interpersonal skills and relationships.   5 Point Likert rating baseline date: 04/29/2021 Target Date Goal Was reviewed Status Code Progress towards goal/Likert rating  04/30/2023 04/28/2022           O 1/5 - pt continues to avoid social opportunities, makes little effort to mobilize around goal to improve interpersonal interactions outside of immediate family              Goal 4) Develop healthy interpersonal relationships that lead to the alleviation and help prevent  the relapse of depression.    5 Point Likert rating baseline date: 04/29/2021 Target Date Goal Was reviewed Status Code Progress towards goal/Likert rating  04/30/2023 04/28/2022          O 1/5 - pt continues to avoid social opportunities, makes little effort to mobilize around goal to improve interpersonal interactions outside of immediate family             This plan has been reviewed and created by the following participants:  This plan will be reviewed at least every 12 months. Date Behavioral Health Clinician Date Guardian/Patient   04/28/2022  Joel Mayer, [name]   04/28/2022 Joel Mayer reports that his daughter Maretta Bees is having ongoing difficult social dynamics to deal with their social groups.  The [facility] of the group has "  black listed" Joel Mayer from social events and things have become very stressful.  We d/e/p what's occurring and how to provide support to his daughter.  Historically, Joel Mayer has found this time of year more challenging in terms of his mood states.  We d/e/p what he needs to be doing proactively in order to maintain a positive mood.  I encouraged him to break out his light therapy lamp, use it daily for at least 20 minutes and return to journaling his morning pages.     Joel Crochet, PhD   ========================================  Wife: Joel Mayer Daughters:   Maretta Bees - Freshman @ [employer] - 7th Grade @ [employer]

## 2022-10-13 ENCOUNTER — Ambulatory Visit (INDEPENDENT_AMBULATORY_CARE_PROVIDER_SITE_OTHER): Payer: 59 | Admitting: Psychology

## 2022-10-13 DIAGNOSIS — F3341 Major depressive disorder, recurrent, in partial remission: Secondary | ICD-10-CM | POA: Diagnosis not present

## 2022-10-13 NOTE — Progress Notes (Signed)
PROGRESS NOTE:  Name: Joel Mayer Date: 10/13/2022 MRN: [id number] DOB: 08-11-1976 PCP: Joel Baton, MD  Time spent: 8:00-8:55 AM   Annual Review: 04/30/2023  Today I met with  Joel Mayer in remote video (WebEx) face-to-face individual psychotherapy.   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   While he feels that things are better, he would like some assistance and moving forward.  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  He has been prescribed Prozac 60 mg 17 years.  He was in therapy with Dr. Rowe Mayer until her retirement.  Dr. Rowe Mayer referred Pt to this therapist.  Pt is married to [place] (45) married since September of '07.  This is a first marriage for both of them. They have two daughters together. Pt reports periodic conflicts with wife and need to be more involved with his daughters.  Mental Status Exam:  Appearance: Casual    Behavior: Appropriate Motor: Normal Speech/Language: Normal Rate Affect: Appropriate Mood: normal Thought process: normal Thought content: WNL Sensory/Perceptual disturbances: WNL Orientation: oriented to person Attention: Good Concentration: Good Memory: Immediate;   Good Fund of knowledge: Good Insight: Good Judgment: Good Impulse Control: Good  Risk Assessment: Danger to Self:  Yes.  with intent/plan Self-injurious Behavior: No Danger to Others: No Duty to Warn:no Physical Aggression / Violence:No  Access to Firearms a concern: No   Substance Abuse History: Current substance abuse: No     Past Psychiatric History:   Previous psychological history is significant for anxiety and depression Outpatient Providers: one previous course of therapy - Dr. Julius Mayer History of Psych Hospitalization: No  Psychological  Testing:  None    Living situation: the patient lives with their family  Sexual Orientation: Straight  Relationship Status: married  Name of spouse / other: Joel Mayer (16) If a parent, number of children / ages:  Joel Mayer ( 14) she is in a rising Joel Mayer (12) she is a rising Research scientist (medical) at [employer]:  No   Income/Employment/Disability: Employment  Armed forces logistics/support/administrative officer: No   Educational History: Education: post Forensic psychologist work or degree  Religion/Sprituality/World View: Atheist  Any cultural differences that may affect / interfere with treatment:  not applicable   Recreation/Hobbies: reading, golf  Stressors: Marital or family conflict    Barriers:  None  Legal History: Pending legal issue / charges: The patient has no significant history of legal issues. History of legal issue / charges:  None  Individualized Treatment Plan                Strengths: Intelligent, resourceful, loyal, good problem solver, persistent, deals with adversity, friendly, humorous  Supports: spouse, friends (mostly out of town), mother   Goal/Needs for Treatment:  In order of importance to patient 1) Learn and implement mindfulness techniques for the purposes of relapse prevention. 2) Increased skills, effectiveness, and confidence in parenting 3) For interpersonal deficits, help the client develop new interpersonal skills and relationships.  4) Develop healthy interpersonal relationships that lead to the alleviation and help prevent  the relapse of depression.   Client Statement of Needs: Requires assistance of being more aware and mindful in his day to day life experience especially as his mood and behaviors impact his relationships with his wife and children.  A focus on relapse  prevention related to depression and maintenance, .   Treatment Level: Biweekly Individual Outpatient Psychotherapy  Symptoms:  History of chronic or recurrent depression for which the client  takes antidepressant medication, diminished interest in or enjoyment of activities, hypersensitivity to the criticism or disapproval of others, Reluctant involvement in social situations out of fear of saying or doing something foolish or of becoming emotional in front of others, reports difficulty in managing the challenging problem behavior of their child, social withdrawal. Displays deficits in parenting knowledge and skills and lacks knowledge regarding reasonable expectations for a child's behavior at a given developmental level.  [name].  Client Treatment Preferences: continue treatment with current therapist   [name]:  Joel Mayer, [name] will support the patient's ability to achieve the goals identified. Cognitive Behavioral Therapy, Dialectical Behavioral Therapy, Motivational Interviewing, behavior activation, parent training, and other evidenced-based practices will be used to promote progress towards healthy functioning.   [name] will: Actively participate in therapy, working towards healthy functioning.    *Justification for Continuation/Discontinuation of Goal: R=Revised, O=Ongoing, A=Achieved, D=Discontinued  Goal 1) Learn and implement mindfulness techniques for the purposes of relapse prevention.  5 Point Likert rating baseline date: 04/29/2021 Target Date Goal Was reviewed Status Code Progress towards goal/Likert rating  04/30/2023 04/28/2022          O 1/5 - pt has only a minimal level of the self awareness necessary to activate coping skills in a timely manner, unable to identify emotions             Goal 2) Increased skills, effectiveness, and confidence in parenting  5 Point Likert rating baseline date: 04/29/2021 Target Date Goal Was reviewed Status Code Progress towards goal/Likert rating  04/30/2023 04/28/2022          O 2/5 - increased awareness of children's needs, increased level of  attachment and engagement             Goal 3) For interpersonal deficits, help the client develop new interpersonal skills and relationships.   5 Point Likert rating baseline date: 04/29/2021 Target Date Goal Was reviewed Status Code Progress towards goal/Likert rating  04/30/2023 04/28/2022           O 1/5 - pt continues to avoid social opportunities, makes little effort to mobilize around goal to improve interpersonal interactions outside of immediate family              Goal 4) Develop healthy interpersonal relationships that lead to the alleviation and help prevent  the relapse of depression.    5 Point Likert rating baseline date: 04/29/2021 Target Date Goal Was reviewed Status Code Progress towards goal/Likert rating  04/30/2023 04/28/2022          O 1/5 - pt continues to avoid social opportunities, makes little effort to mobilize around goal to improve interpersonal interactions outside of immediate family             This plan has been reviewed and created by the following participants:  This plan will be reviewed at least every 12 months. Date Behavioral Health Clinician Date Guardian/Patient   04/28/2022  Royetta Mayer, [name]   04/28/2022 Joel Mayer has been talking about his daughter Maretta Bees and the ongoing difficult social dynamics to deal with their social groups.  It was an emotional weekend  as they discovered she has snuck out and met up with some boys on two occasions while at a sleepover with a friend.  We d/e/p what occurred, his reactions and concerns.  We d/ some ways to provide support, appropriate limits and stay engaged in the process with his daughter when she is only confiding in her mother.    [name], PhD   ========================================  Wife: Joel Mayer Daughters:  Maretta Bees - Freshman @ [employer] - 7th Grade @ [employer]

## 2022-10-27 ENCOUNTER — Ambulatory Visit (INDEPENDENT_AMBULATORY_CARE_PROVIDER_SITE_OTHER): Payer: 59 | Admitting: Psychology

## 2022-10-27 DIAGNOSIS — F3341 Major depressive disorder, recurrent, in partial remission: Secondary | ICD-10-CM | POA: Diagnosis not present

## 2022-10-27 NOTE — Progress Notes (Signed)
PROGRESS NOTE:  Name: Joel Mayer Date: 10/27/2022 MRN: [id number] DOB: Mar 13, 1976 PCP: Shon Baton, MD  Time spent: 8:00-8:55 AM   Annual Review: 04/30/2023  Today I met with  [name] in remote video (WebEx) face-to-face individual psychotherapy.   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   While he feels that things are better, he would like some assistance and moving forward.  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  He has been prescribed Prozac 60 mg 17 years.  He was in therapy with Dr. Rowe Robert until her retirement.  Dr. Rowe Robert referred Pt to this therapist.  Pt is married to Joel Mayer (44) married since September of '07.  This is a first marriage for both of them. They have two daughters together. Pt reports periodic conflicts with wife and need to be more involved with his daughters.  Mental Status Exam:  Appearance: Casual    Behavior: Appropriate Motor: Normal Speech/Language: Normal Rate Affect: Appropriate Mood: normal Thought process: normal Thought content: WNL Sensory/Perceptual disturbances: WNL Orientation: oriented to person Attention: Good Concentration: Good Memory: Immediate;   Good Fund of knowledge: Good Insight: Good Judgment: Good Impulse Control: Good  Risk Assessment: Danger to Self:  Yes.  with intent/plan Self-injurious Behavior: No Danger to Others: No Duty to Warn:no Physical Aggression / Violence:No  Access to Firearms a concern: No   Substance Abuse History: Current substance abuse: No     Past Psychiatric History:   Previous psychological history is significant for anxiety and depression Outpatient Providers: one previous course of therapy - Dr. Julius Bowels History of Psych Hospitalization: No   Psychological Testing:  None    Living situation: the patient lives with their family  Sexual Orientation: Straight  Relationship Status: married  Name of spouse / other: Joel Mayer (7) If a parent, number of children / ages:  Joel Mayer ( 14) she is in a rising [name] (12) she is a rising Joel Mayer (medical) at [employer]:  No   Income/Employment/Disability: Employment  Armed forces logistics/support/administrative officer: No   Educational History: Education: post Forensic psychologist work or degree  Religion/Sprituality/World View: Atheist  Any cultural differences that may affect / interfere with treatment:  not applicable   Recreation/Hobbies: reading, golf  Stressors: Marital or family conflict    Barriers:  None  Legal History: Pending legal issue / charges: The patient has no significant history of legal issues. History of legal issue / charges:  None  Individualized Treatment Plan                Strengths: Intelligent, resourceful, loyal, good problem solver, persistent, deals with adversity, friendly, humorous  Supports: spouse, friends (mostly out of town), mother   Goal/Needs for Treatment:  In order of importance to patient 1) Learn and implement mindfulness techniques for the purposes of relapse prevention. 2) Increased skills, effectiveness, and confidence in parenting 3) For interpersonal deficits, help the client develop new interpersonal skills and relationships.  4) Develop healthy interpersonal relationships that lead to the alleviation and help prevent  the relapse of depression.   Client Statement of Needs: Requires assistance of being more aware and mindful in his day to day life experience especially as his mood and behaviors impact his relationships with his wife and children.  A focus on  relapse prevention related to depression and maintenance, .   Treatment Level: Biweekly Individual Outpatient Psychotherapy  Symptoms:  History of chronic or recurrent depression for which  the client takes antidepressant medication, diminished interest in or enjoyment of activities, hypersensitivity to the criticism or disapproval of others, Reluctant involvement in social situations out of fear of saying or doing something foolish or of becoming emotional in front of others, reports difficulty in managing the challenging problem behavior of their child, social withdrawal. Displays deficits in parenting knowledge and skills and lacks knowledge regarding reasonable expectations for a child's behavior at a given developmental level.  [name].  Client Treatment Preferences: continue treatment with current therapist   [name]:  Psychologist, [name], [name] will support the patient's ability to achieve the goals identified. Cognitive Behavioral Therapy, Dialectical Behavioral Therapy, Motivational Interviewing, behavior activation, parent training, and other evidenced-based practices will be used to promote progress towards healthy functioning.   Healthcare consumer Chayton "Joel Mayer" Mayer will: Actively participate in therapy, working towards healthy functioning.    *Justification for Continuation/Discontinuation of Goal: R=Revised, O=Ongoing, A=Achieved, D=Discontinued  Goal 1) Learn and implement mindfulness techniques for the purposes of relapse prevention.  5 Point Likert rating baseline date: 04/29/2021 Target Date Goal Was reviewed Status Code Progress towards goal/Likert rating  04/30/2023 04/28/2022          O 1/5 - pt has only a minimal level of the self awareness necessary to activate coping skills in a timely manner, unable to identify emotions             Goal 2) Increased skills, effectiveness, and confidence in parenting  5 Point Likert rating baseline date: 04/29/2021 Target Date Goal Was reviewed Status Code Progress towards goal/Likert rating  04/30/2023 04/28/2022          O 2/5 - increased awareness of children's needs, increased  level of attachment and engagement             Goal 3) For interpersonal deficits, help the client develop new interpersonal skills and relationships.   5 Point Likert rating baseline date: 04/29/2021 Target Date Goal Was reviewed Status Code Progress towards goal/Likert rating  04/30/2023 04/28/2022           O 1/5 - pt continues to avoid social opportunities, makes little effort to mobilize around goal to improve interpersonal interactions outside of immediate family              Goal 4) Develop healthy interpersonal relationships that lead to the alleviation and help prevent  the relapse of depression.    5 Point Likert rating baseline date: 04/29/2021 Target Date Goal Was reviewed Status Code Progress towards goal/Likert rating  04/30/2023 04/28/2022          O 1/5 - pt continues to avoid social opportunities, makes little effort to mobilize around goal to improve interpersonal interactions outside of immediate family             This plan has been reviewed and created by the following participants:  This plan will be reviewed at least every 12 months. Date Behavioral Health Clinician Date Guardian/Patient   04/28/2022  [name], [name]   04/28/2022 Tyrece "Joel Mayer" Dmarius Reeder reached out and reported that in the middle of last week that his daughter was going through a mental health crisis.  He shared  what has been building up and how it was related to her recent acting out.  We d/e/p how they are working through it.  I offered some additional ideas for how to provide support, allow his daughter to know that he is aware that she is going through a rough time and to know he is available.    [name], PhD   ========================================  Wife: Joel Mayer Daughters:  Maretta Bees - Freshman @ [employer] - 7th Grade @ [employer]

## 2022-11-24 ENCOUNTER — Ambulatory Visit: Payer: 59 | Admitting: Psychology

## 2022-12-08 ENCOUNTER — Ambulatory Visit (INDEPENDENT_AMBULATORY_CARE_PROVIDER_SITE_OTHER): Payer: 59 | Admitting: Psychology

## 2022-12-08 DIAGNOSIS — F411 Generalized anxiety disorder: Secondary | ICD-10-CM | POA: Diagnosis not present

## 2022-12-08 NOTE — Progress Notes (Addendum)
PROGRESS NOTE:  Name: Joel Mayer Date: 12/08/2022 MRN: [id number] DOB: Sep 06, 1976 PCP: Shon Baton, MD  Time spent: 8:00-8:55 AM   Annual Review: 04/30/2023  Today I met with  [name] in remote video (WebEx) face-to-face individual psychotherapy.   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   While he feels that things are better, he would like some assistance and moving forward.  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  He has been prescribed Prozac 60 mg 17 years.  He was in therapy with Dr. Rowe Robert until her retirement.  Dr. Rowe Robert referred Pt to this therapist.  Pt is married to Joel Mayer (44) married since September of '07.  This is a first marriage for both of them. They have two daughters together. Pt reports periodic conflicts with wife and need to be more involved with his daughters.  Mental Status Exam:  Appearance: Casual    Behavior: Appropriate Motor: Normal Speech/Language: Normal Rate Affect: Appropriate Mood: normal Thought process: normal Thought content: WNL Sensory/Perceptual disturbances: WNL Orientation: oriented to person Attention: Good Concentration: Good Memory: Immediate;   Good Fund of knowledge: Good Insight: Good Judgment: Good Impulse Control: Good  Risk Assessment: Danger to Self:  Yes.  with intent/plan Self-injurious Behavior: No Danger to Others: No Duty to Warn:no Physical Aggression / Violence:No  Access to Firearms a concern: No   Substance Abuse History: Current substance abuse: No     Past Psychiatric History:   Previous psychological history is significant for anxiety and depression Outpatient Providers: one previous course of therapy - Dr. Julius Bowels History of Psych Hospitalization: No  Psychological  Testing:  None    Living situation: the patient lives with their family  Sexual Orientation: Straight  Relationship Status: married  Name of spouse / other: Joel Mayer (77) If a parent, number of children / ages:  Joel Mayer ( 14) she is in a rising [name] (12) she is a rising Joel Mayer:  No   Income/Employment/Disability: Employment  Armed forces logistics/support/administrative officer: No   Educational History: Education: post Forensic psychologist work or degree  Religion/Sprituality/World View: Atheist  Any cultural differences that may affect / interfere with treatment:  not applicable   Recreation/Hobbies: reading, golf  Stressors: Marital or family conflict    Barriers:  None  Legal History: Pending legal issue / charges: The patient has no significant history of legal issues. History of legal issue / charges:  None  Individualized Treatment Plan                Strengths: Intelligent, resourceful, loyal, good problem solver, persistent, deals with adversity, friendly, humorous  Supports: spouse, friends (mostly out of town), mother   Goal/Needs for Treatment:  In order of importance to patient 1) Learn and implement mindfulness techniques for the purposes of relapse prevention. 2) Increased skills, effectiveness, and confidence in parenting 3) For interpersonal deficits, help the client develop new interpersonal skills and relationships.  4) Develop healthy interpersonal relationships that lead to the alleviation and help prevent  the relapse of depression.   Client Statement of Needs: Requires assistance of being more aware and mindful in his day to day life experience especially as his mood and behaviors impact his relationships with his wife and children.  A focus on  relapse prevention related to depression and maintenance, .   Treatment Level: Biweekly Individual Outpatient Psychotherapy  Symptoms:  History of chronic or recurrent depression for which the client  takes antidepressant medication, diminished interest in or enjoyment of activities, hypersensitivity to the criticism or disapproval of others, Reluctant involvement in social situations out of fear of saying or doing something foolish or of becoming emotional in front of others, reports difficulty in managing the challenging problem behavior of their child, social withdrawal. Displays deficits in parenting knowledge and skills and lacks knowledge regarding reasonable expectations for a child's behavior at a given developmental level.  [name].  Client Treatment Preferences: continue treatment with current therapist   [name]:  Psychologist, [name], [name] will support the patient's ability to achieve the goals identified. Cognitive Behavioral Therapy, Dialectical Behavioral Therapy, Motivational Interviewing, behavior activation, parent training, and other evidenced-based practices will be used to promote progress towards healthy functioning.   Healthcare consumer [place] "Joel Mayer" Quander will: Actively participate in therapy, working towards healthy functioning.    *Justification for Continuation/Discontinuation of Goal: R=Revised, O=Ongoing, A=Achieved, D=Discontinued  Goal 1) Learn and implement mindfulness techniques for the purposes of relapse prevention.  5 Point Likert rating baseline date: 04/29/2021 Target Date Goal Was reviewed Status Code Progress towards goal/Likert rating  04/30/2023 04/28/2022          O 1/5 - pt has only a minimal level of the self awareness necessary to activate coping skills in a timely manner, unable to identify emotions             Goal 2) Increased skills, effectiveness, and confidence in parenting  5 Point Likert rating baseline date: 04/29/2021 Target Date Goal Was reviewed Status Code Progress towards goal/Likert rating  04/30/2023 04/28/2022          O 2/5 - increased awareness of children's needs, increased level of  attachment and engagement             Goal 3) For interpersonal deficits, help the client develop new interpersonal skills and relationships.   5 Point Likert rating baseline date: 04/29/2021 Target Date Goal Was reviewed Status Code Progress towards goal/Likert rating  04/30/2023 04/28/2022           O 1/5 - pt continues to avoid social opportunities, makes little effort to mobilize around goal to improve interpersonal interactions outside of immediate family              Goal 4) Develop healthy interpersonal relationships that lead to the alleviation and help prevent  the relapse of depression.    5 Point Likert rating baseline date: 04/29/2021 Target Date Goal Was reviewed Status Code Progress towards goal/Likert rating  04/30/2023 04/28/2022          O 1/5 - pt continues to avoid social opportunities, makes little effort to mobilize around goal to improve interpersonal interactions outside of immediate family             This plan has been reviewed and created by the following participants:  This plan will be reviewed at least every 12 months. Date Behavioral Health Clinician Date Guardian/Patient   04/28/2022  [name], [name]   04/28/2022 Theda Belfast" Tuman                   Diagnosis: Generalized Anxiety Disorder   Joel Mayer reports that the holidays were rather hectic with a dose of family drama.  He updated me on how  his daughter was managing since she started medication and therapy.  I provided insight, support and guidance.  Joel Mayer states that his younger daughter asked to go to the gym in the mornings.  He has been "assigned" the task to take her.  We d/ that while he grudgingly agreed, he has begun to like it.  For the longest time, I have been encouraging him to exercise since he started therapy.  It has been a struggle for him to get started and be consistent.  We d/ how difficult it is when there is a shift in routine and for the need to both plan and mentally prep for the  change.    [name], PhD   ========================================  Wife: Joel Mayer Daughters:  Maretta Bees - Freshman @ [employer] - 7th Grade @ [place]

## 2022-12-22 ENCOUNTER — Ambulatory Visit (INDEPENDENT_AMBULATORY_CARE_PROVIDER_SITE_OTHER): Payer: 59 | Admitting: Psychology

## 2022-12-22 DIAGNOSIS — F411 Generalized anxiety disorder: Secondary | ICD-10-CM

## 2022-12-22 NOTE — Progress Notes (Addendum)
PROGRESS NOTE:  Name: Joel Mayer Date: 12/22/2022 MRN: [id number] DOB: 01-01-1976 PCP: Shon Baton, MD  Time spent: 8:00-8:55 AM   Annual Review: 04/30/2023  Today I met with  Joel Mayer in remote video (WebEx) face-to-face individual psychotherapy.   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   While he feels that things are better, he would like some assistance and moving forward.  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  He has been prescribed Prozac 60 mg 17 years.  He was in therapy with Dr. Rowe Robert until her retirement.  Dr. Rowe Robert referred Pt to this therapist.  Pt is married to [place] (44) married since September of '07.  This is a first marriage for both of them. They have two daughters together. Pt reports periodic conflicts with wife and need to be more involved with his daughters.  Mental Status Exam:  Appearance: Casual    Behavior: Appropriate Motor: Normal Speech/Language: Normal Rate Affect: Appropriate Mood: normal Thought process: normal Thought content: WNL Sensory/Perceptual disturbances: WNL Orientation: oriented to person Attention: Good Concentration: Good Memory: Immediate;   Good Fund of knowledge: Good Insight: Good Judgment: Good Impulse Control: Good  Risk Assessment: Danger to Self:  Yes.  with intent/plan Self-injurious Behavior: No Danger to Others: No Duty to Warn:no Physical Aggression / Violence:No  Access to Firearms a concern: No   Substance Abuse History: Current substance abuse: No     Past Psychiatric History:   Previous psychological history is significant for anxiety and depression Outpatient Providers: one previous course of therapy - Dr. Julius Bowels History of Psych Hospitalization: No  Psychological  Testing:  None    Living situation: the patient lives with their family  Sexual Orientation: Straight  Relationship Status: married  Name of spouse / other: Joel Mayer (69) If a parent, number of children / ages:  [name] ( 14) she is in a rising [name] (12) she is a rising Research scientist (medical) at [employer]:  No   Income/Employment/Disability: Employment  Armed forces logistics/support/administrative officer: No   Educational History: Education: post Forensic psychologist work or degree  Religion/Sprituality/World View: Atheist  Any cultural differences that may affect / interfere with treatment:  not applicable   Recreation/Hobbies: reading, golf  Stressors: Marital or family conflict    Barriers:  None  Legal History: Pending legal issue / charges: The patient has no significant history of legal issues. History of legal issue / charges:  None  Individualized Treatment Plan                Strengths: Intelligent, resourceful, loyal, good problem solver, persistent, deals with adversity, friendly, humorous  Supports: spouse, friends (mostly out of town), mother   Goal/Needs for Treatment:  In order of importance to patient 1) Learn and implement mindfulness techniques for the purposes of relapse prevention. 2) Increased skills, effectiveness, and confidence in parenting 3) For interpersonal deficits, help the client develop new interpersonal skills and relationships.  4) Develop healthy interpersonal relationships that lead to the alleviation and help prevent  the relapse of depression.   Client Statement of Needs: Requires assistance of being more aware and mindful in his day to day life experience especially as his mood and behaviors impact his relationships with his wife and children.  A focus on  relapse prevention related to depression and maintenance, .   Treatment Level: Biweekly Individual Outpatient Psychotherapy  Symptoms:  History of chronic or recurrent depression for which the client  takes antidepressant medication, diminished interest in or enjoyment of activities, hypersensitivity to the criticism or disapproval of others, Reluctant involvement in social situations out of fear of saying or doing something foolish or of becoming emotional in front of others, reports difficulty in managing the challenging problem behavior of their child, social withdrawal. Displays deficits in parenting knowledge and skills and lacks knowledge regarding reasonable expectations for a child's behavior at a given developmental level.  [name].  Client Treatment Preferences: continue treatment with current therapist   [name]:  Psychologist, Joel Mayer, [name] will support the patient's ability to achieve the goals identified. Cognitive Behavioral Therapy, Dialectical Behavioral Therapy, Motivational Interviewing, behavior activation, parent training, and other evidenced-based practices will be used to promote progress towards healthy functioning.   [name] will: Actively participate in therapy, working towards healthy functioning.    *Justification for Continuation/Discontinuation of Goal: R=Revised, O=Ongoing, A=Achieved, D=Discontinued  Goal 1) Learn and implement mindfulness techniques for the purposes of relapse prevention.  5 Point Likert rating baseline date: 04/29/2021 Target Date Goal Was reviewed Status Code Progress towards goal/Likert rating  04/30/2023 04/28/2022          O 1/5 - pt has only a minimal level of the self awareness necessary to activate coping skills in a timely manner, unable to identify emotions             Goal 2) Increased skills, effectiveness, and confidence in parenting  5 Point Likert rating baseline date: 04/29/2021 Target Date Goal Was reviewed Status Code Progress towards goal/Likert rating  04/30/2023 04/28/2022          O 2/5 - increased awareness of children's needs, increased level of  attachment and engagement             Goal 3) For interpersonal deficits, help the client develop new interpersonal skills and relationships.   5 Point Likert rating baseline date: 04/29/2021 Target Date Goal Was reviewed Status Code Progress towards goal/Likert rating  04/30/2023 04/28/2022           O 1/5 - pt continues to avoid social opportunities, makes little effort to mobilize around goal to improve interpersonal interactions outside of immediate family              Goal 4) Develop healthy interpersonal relationships that lead to the alleviation and help prevent  the relapse of depression.    5 Point Likert rating baseline date: 04/29/2021 Target Date Goal Was reviewed Status Code Progress towards goal/Likert rating  04/30/2023 04/28/2022          O 1/5 - pt continues to avoid social opportunities, makes little effort to mobilize around goal to improve interpersonal interactions outside of immediate family             This plan has been reviewed and created by the following participants:  This plan will be reviewed at least every 12 months. Date Behavioral Health Clinician Date Guardian/Patient   04/28/2022  Joel Mayer, [name]   04/28/2022 Joel Mayer                   Diagnosis: Generalized Anxiety Disorder   Joel Mayer reports that they had a big revelation over the weekend regarding their oldest daughter.  We d/e/p the information  they discovered, how they responded and what was set in place to encouraged healthier interactions with her friends.  After some d/ he was able to admit that maybe they have been too permissive and don't demand much of the girls.  I encouraged him to think about what he felt like he wanted to girls to be able to do for themselves and become more independent before they go off to college.  Joel Mayer agreed to journal on this topic as a prompt over the next two weeks until we meet again.   Joel Crochet,  PhD   ========================================  Wife: Joel Mayer Daughters:  Maretta Bees - Freshman @ [employer] - 7th Grade @ [place]

## 2023-01-05 ENCOUNTER — Ambulatory Visit (INDEPENDENT_AMBULATORY_CARE_PROVIDER_SITE_OTHER): Payer: 59 | Admitting: Psychology

## 2023-01-05 DIAGNOSIS — F411 Generalized anxiety disorder: Secondary | ICD-10-CM | POA: Diagnosis not present

## 2023-01-05 NOTE — Progress Notes (Addendum)
PROGRESS NOTE:  Name: Joel Mayer Date: 01/05/2023 MRN: [id number] DOB: 13-Oct-1976 PCP: Shon Baton, MD  Time spent: 8:00-8:55 AM   Annual Review: 04/30/2023  Today I met with  [name] in remote video (WebEx) face-to-face individual psychotherapy.   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   While he feels that things are better, he would like some assistance and moving forward.  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  He has been prescribed Prozac 60 mg 17 years.  He was in therapy with Dr. Rowe Robert until her retirement.  Dr. Rowe Robert referred Pt to this therapist.  Pt is married to [place] (45) married since September of '07.  This is a first marriage for both of them. They have two daughters together. Pt reports periodic conflicts with wife and need to be more involved with his daughters.  Mental Status Exam:  Appearance: Casual    Behavior: Appropriate Motor: Normal Speech/Language: Normal Rate Affect: Appropriate Mood: normal Thought process: normal Thought content: WNL Sensory/Perceptual disturbances: WNL Orientation: oriented to person Attention: Good Concentration: Good Memory: Immediate;   Good Fund of knowledge: Good Insight: Good Judgment: Good Impulse Control: Good  Risk Assessment: Danger to Self:  Yes.  with intent/plan Self-injurious Behavior: No Danger to Others: No Duty to Warn:no Physical Aggression / Violence:No  Access to Firearms a concern: No   Substance Abuse History: Current substance abuse: No     Past Psychiatric History:   Previous psychological history is significant for anxiety and depression Outpatient Providers: one previous course of therapy - Dr. Julius Bowels History of Psych Hospitalization: No  Psychological  Testing:  None    Living situation: the patient lives with their family  Sexual Orientation: Straight  Relationship Status: married  Name of spouse / other: Joel Mayer (57) If a parent, number of children / ages:  Joel Mayer ( 14) she is in a rising [name] (12) she is a rising Joel scientist (medical) at [employer]:  No   Income/Employment/Disability: Employment  Armed forces logistics/support/administrative officer: No   Educational History: Education: post Forensic psychologist work or degree  Religion/Sprituality/World View: Atheist  Any cultural differences that may affect / interfere with treatment:  not applicable   Recreation/Hobbies: reading, golf  Stressors: Marital or family conflict    Barriers:  None  Legal History: Pending legal issue / charges: The patient has no significant history of legal issues. History of legal issue / charges:  None  Individualized Treatment Plan                Strengths: Intelligent, resourceful, loyal, good problem solver, persistent, deals with adversity, friendly, humorous  Supports: spouse, friends (mostly out of town), mother   Goal/Needs for Treatment:  In order of importance to patient 1) Learn and implement mindfulness techniques for the purposes of relapse prevention. 2) Increased skills, effectiveness, and confidence in parenting 3) For interpersonal deficits, help the client develop new interpersonal skills and relationships.  4) Develop healthy interpersonal relationships that lead to the alleviation and help prevent  the relapse of depression.   Client Statement of Needs: Requires assistance of being more aware and mindful in his day to day life experience especially as his mood and behaviors impact his relationships with his wife and children.  A focus on relapse  prevention related to depression and maintenance, .   Treatment Level: Biweekly Individual Outpatient Psychotherapy  Symptoms:  History of chronic or recurrent depression for which the client  takes antidepressant medication, diminished interest in or enjoyment of activities, hypersensitivity to the criticism or disapproval of others, Reluctant involvement in social situations out of fear of saying or doing something foolish or of becoming emotional in front of others, reports difficulty in managing the challenging problem behavior of their child, social withdrawal. Displays deficits in parenting knowledge and skills and lacks knowledge regarding reasonable expectations for a child's behavior at a given developmental level.  [name].  Client Treatment Preferences: continue treatment with current therapist   [name]:  Psychologist, [name], [name] will support the patient's ability to achieve the goals identified. Cognitive Behavioral Therapy, Dialectical Behavioral Therapy, Motivational Interviewing, behavior activation, parent training, and other evidenced-based practices will be used to promote progress towards healthy functioning.   Healthcare consumer [name] "Joel Mayer" Reedus will: Actively participate in therapy, working towards healthy functioning.    *Justification for Continuation/Discontinuation of Goal: R=Revised, O=Ongoing, A=Achieved, D=Discontinued  Goal 1) Learn and implement mindfulness techniques for the purposes of relapse prevention.  5 Point Likert rating baseline date: 04/29/2021 Target Date Goal Was reviewed Status Code Progress towards goal/Likert rating  04/30/2023 04/28/2022          O 1/5 - pt has only a minimal level of the self awareness necessary to activate coping skills in a timely manner, unable to identify emotions             Goal 2) Increased skills, effectiveness, and confidence in parenting  5 Point Likert rating baseline date: 04/29/2021 Target Date Goal Was reviewed Status Code Progress towards goal/Likert rating  04/30/2023 04/28/2022          O 2/5 - increased awareness of children's needs, increased level of  attachment and engagement             Goal 3) For interpersonal deficits, help the client develop new interpersonal skills and relationships.   5 Point Likert rating baseline date: 04/29/2021 Target Date Goal Was reviewed Status Code Progress towards goal/Likert rating  04/30/2023 04/28/2022           O 1/5 - pt continues to avoid social opportunities, makes little effort to mobilize around goal to improve interpersonal interactions outside of immediate family              Goal 4) Develop healthy interpersonal relationships that lead to the alleviation and help prevent  the relapse of depression.    5 Point Likert rating baseline date: 04/29/2021 Target Date Goal Was reviewed Status Code Progress towards goal/Likert rating  04/30/2023 04/28/2022          O 1/5 - pt continues to avoid social opportunities, makes little effort to mobilize around goal to improve interpersonal interactions outside of immediate family             This plan has been reviewed and created by the following participants:  This plan will be reviewed at least every 12 months. Date Behavioral Health Clinician Date Guardian/Patient   04/28/2022  [name], [name]   04/28/2022 Theda Belfast" Spinner                  Diagnosis: Generalized Anxiety Disorder    Joel Mayer reports that he was concerned that his mother's cognitive health is declining.  He shared a recent situation that has  him feeling like he needs to intervene but is uncertain how to talk to her.  I made some inquires and offered some scripting for how to approach his mother.  As is typical of Joel Mayer, he is apt to want to avoid difficult situations.  I offered some    [name], PhD   ========================================  Wife: Joel Mayer Daughters:  Joel Mayer - Freshman @ [employer] - 7th Grade @ [place]

## 2023-01-19 ENCOUNTER — Ambulatory Visit (INDEPENDENT_AMBULATORY_CARE_PROVIDER_SITE_OTHER): Payer: 59 | Admitting: Psychology

## 2023-01-19 DIAGNOSIS — F411 Generalized anxiety disorder: Secondary | ICD-10-CM | POA: Diagnosis not present

## 2023-01-19 NOTE — Progress Notes (Addendum)
PROGRESS NOTE:  Name: Joel Mayer Date: 01/19/2023 MRN: [id number] DOB: [birth year] PCP: Shon Baton, MD  Time spent: 8:00-8:55 AM   Annual Review: 04/30/2023  Today I met with  Joel Mayer in remote video (WebEx) face-to-face individual psychotherapy.   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   While he feels that things are better, he would like some assistance and moving forward.  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  He has been prescribed Prozac 60 mg 17 years.  He was in therapy with Dr. Rowe Robert until her retirement.  Dr. Rowe Robert referred Pt to this therapist.  Pt is married to [place] (44) married since September of '07.  This is a first marriage for both of them. They have two daughters together. Pt reports periodic conflicts with wife and need to be more involved with his daughters.  Mental Status Exam:  Appearance: Casual    Behavior: Appropriate Motor: Normal Speech/Language: Normal Rate Affect: Appropriate Mood: normal Thought process: normal Thought content: WNL Sensory/Perceptual disturbances: WNL Orientation: oriented to person Attention: Good Concentration: Good Memory: Immediate;   Good Fund of knowledge: Good Insight: Good Judgment: Good Impulse Control: Good  Risk Assessment: Danger to Self:  Yes.  with intent/plan Self-injurious Behavior: No Danger to Others: No Duty to Warn:no Physical Aggression / Violence:No  Access to Firearms a concern: No   Substance Abuse History: Current substance abuse: No     Past Psychiatric History:   Previous psychological history is significant for anxiety and depression Outpatient Providers: one previous course of therapy - Dr. Julius Bowels History of Psych Hospitalization: No  Psychological  Testing:  None    Living situation: the patient lives with their family  Sexual Orientation: Straight  Relationship Status: married  Name of spouse / other: [name] (46) If a parent, number of children / ages:  [name] ( 14) she is in a rising [name] (12) she is a rising Research scientist (medical) at [employer]: Intelligent, resourceful, loyal, good problem solver, persistent, deals with adversity, friendly, humorous  Supports: spouse, friends (mostly out of town), mother   Goal/Needs for Treatment:  In order of importance to patient 1) Learn and implement mindfulness techniques for the purposes of relapse prevention. 2) Increased skills, effectiveness, and confidence in parenting 3) For interpersonal deficits, help the client develop new interpersonal skills and relationships.  4) Develop healthy interpersonal relationships that lead to the alleviation and help prevent  the relapse of depression.   Client Statement of Needs: Requires assistance of being more aware and mindful in his day to day life experience especially as his mood and behaviors impact his relationships with his wife and children.  A focus on relapse prevention related to depression and maintenance, .   Treatment Level: Biweekly Individual Outpatient Psychotherapy  Symptoms:  History of chronic or recurrent depression for which the client takes antidepressant medication, diminished interest in or enjoyment of activities, hypersensitivity to the criticism or disapproval of others, Reluctant involvement in social situations out of fear of saying or doing something foolish or of becoming emotional in front of others, reports difficulty in managing the challenging problem behavior of their child, social withdrawal. Displays deficits  in parenting knowledge and skills and lacks knowledge regarding reasonable expectations for a child's behavior at a given developmental level.   [name].  Client Treatment Preferences: continue treatment with current therapist   [name]:  Psychologist, [name], [name] will support the patient's ability to achieve the goals identified. Cognitive Behavioral Therapy, Dialectical Behavioral Therapy, Motivational Interviewing, behavior activation, parent training, and other evidenced-based practices will be used to promote progress towards healthy functioning.   Healthcare consumer [name] will: Actively participate in therapy, working towards healthy functioning.    *Justification for Continuation/Discontinuation of Goal: R=Revised, O=Ongoing, A=Achieved, D=Discontinued  Goal 1) Learn and implement mindfulness techniques for the purposes of relapse prevention.  5 Point Likert rating baseline date: 04/29/2021 Target Date Goal Was reviewed Status Code Progress towards goal/Likert rating  04/30/2023 04/28/2022          O 1/5 - pt has only a minimal level of the self awareness necessary to activate coping skills in a timely manner, unable to identify emotions             Goal 2) Increased skills, effectiveness, and confidence in parenting  5 Point Likert rating baseline date: 04/29/2021 Target Date Goal Was reviewed Status Code Progress towards goal/Likert rating  04/30/2023 04/28/2022          O 2/5 - increased awareness of children's needs, increased level of attachment and engagement             Goal 3) For interpersonal deficits, help the client develop new interpersonal skills and relationships.   5 Point Likert rating baseline date: 04/29/2021 Target Date Goal Was reviewed Status Code Progress towards goal/Likert rating  04/30/2023 04/28/2022           O 1/5 - pt continues to avoid social opportunities, makes little effort to mobilize around goal to improve interpersonal interactions outside of immediate family              Goal 4) Develop healthy interpersonal relationships that  lead to the alleviation and help prevent  the relapse of depression.    5 Point Likert rating baseline date: 04/29/2021 Target Date Goal Was reviewed Status Code Progress towards goal/Likert rating  04/30/2023 04/28/2022          O 1/5 - pt continues to avoid social opportunities, makes little effort to mobilize around goal to improve interpersonal interactions outside of immediate family             This plan has been reviewed and created by the following participants:  This plan will be reviewed at least every 12 months. Date Behavioral Health Clinician Date Guardian/Patient   04/28/2022  [name], [name]   04/28/2022 Theda Belfast" Maxon                   Diagnosis: Generalized Anxiety Disorder   In our last session, Joel Mayer voiced his concerns about his mother's declining cognitive health.  He f/t and spoke to hi smother about his concerns.  She stated that she spoke to her PCP who assessed her and told her she was fine.  Joel Mayer and I d/e/p what was said, what likely occurred and next steps to take.  Joel Mayer states that his daughter Anda Kraft is doing much better post crisis.  We talked about Anda Kraft going to high school next year and d/e what would make a good match for her given her introverted temperament.     [name],  PhD   ========================================  Wife: [name] Daughters:  [name] - Freshman @ [employer] - 7th Grade @ [place]

## 2023-01-26 ENCOUNTER — Other Ambulatory Visit: Payer: Self-pay | Admitting: Ophthalmology

## 2023-02-02 ENCOUNTER — Ambulatory Visit (INDEPENDENT_AMBULATORY_CARE_PROVIDER_SITE_OTHER): Payer: 59 | Admitting: Psychology

## 2023-02-02 DIAGNOSIS — F411 Generalized anxiety disorder: Secondary | ICD-10-CM

## 2023-02-02 NOTE — Progress Notes (Signed)
PROGRESS NOTE:  Name: Joel Mayer Date: 02/02/2023 MRN: [id number] DOB: May 25, 1976 PCP: Shon Baton, MD  Time spent: 8:00-8:55 AM   Annual Review: 04/30/2023  Today I met with  Joel Mayer in remote video (WebEx) face-to-face individual psychotherapy.   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   While he feels that things are better, he would like some assistance and moving forward.  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  He has been prescribed Prozac 60 mg 17 years.  He was in therapy with Dr. Rowe Robert until her retirement.  Dr. Rowe Robert referred Pt to this therapist.  Pt is married to [place] (45) married since September of '07.  This is a first marriage for both of them. They have two daughters together. Pt reports periodic conflicts with wife and need to be more involved with his daughters.  Mental Status Exam:  Appearance: Casual    Behavior: Appropriate Motor: Normal Speech/Language: Normal Rate Affect: Appropriate Mood: normal Thought process: normal Thought content: WNL Sensory/Perceptual disturbances: WNL Orientation: oriented to person Attention: Good Concentration: Good Memory: Immediate;   Good Fund of knowledge: Good Insight: Good Judgment: Good Impulse Control: Good    Individualized Treatment Plan                Strengths: Intelligent, resourceful, loyal, good problem solver, persistent, deals with adversity, friendly, humorous  Supports: spouse, friends (mostly out of town), mother   Goal/Needs for Treatment:  In order of importance to patient 1) Learn and implement mindfulness techniques for the purposes of relapse prevention. 2) Increased skills, effectiveness, and confidence in parenting 3) For interpersonal deficits, help the client  develop new interpersonal skills and relationships.  4) Develop healthy interpersonal relationships that lead to the alleviation and help prevent  the relapse of depression.   Client Statement of Needs: Requires assistance of being more aware and mindful in his day to day life experience especially as his mood and behaviors impact his relationships with his wife and children.  A focus on relapse prevention related to depression and maintenance, .   Treatment Level: Biweekly Individual Outpatient Psychotherapy  Symptoms:  History of chronic or recurrent depression for which the client takes antidepressant medication, diminished interest in or enjoyment of activities, hypersensitivity to the criticism or disapproval of others, Reluctant involvement in social situations out of fear of saying or doing something foolish or of becoming emotional in front of others, reports difficulty in managing the challenging problem behavior of their child, social withdrawal. Displays deficits in parenting knowledge and skills and lacks knowledge regarding reasonable expectations for a child's behavior at a given developmental level.  [name].  Client Treatment Preferences: continue treatment with current therapist   [name]:  Psychologist, [name], [name] will support the patient's ability to achieve the goals identified. Cognitive Behavioral Therapy, Dialectical Behavioral Therapy, Motivational Interviewing, behavior activation, parent training, and other evidenced-based practices will be used to promote progress towards healthy functioning.   Healthcare consumer Charly "Joel Mayer" Balling will: Actively participate in therapy, working towards healthy functioning.    *Justification for Continuation/Discontinuation of Goal: R=Revised, O=Ongoing, A=Achieved, D=Discontinued  Goal 1) Learn and implement mindfulness techniques for the purposes of relapse prevention.  5 Point Likert  rating baseline date: 04/29/2021 Target Date Goal Was reviewed  Status Code Progress towards goal/Likert rating  04/30/2023 04/28/2022          O 1/5 - pt has only a minimal level of the self awareness necessary to activate coping skills in a timely manner, unable to identify emotions             Goal 2) Increased skills, effectiveness, and confidence in parenting  5 Point Likert rating baseline date: 04/29/2021 Target Date Goal Was reviewed Status Code Progress towards goal/Likert rating  04/30/2023 04/28/2022          O 2/5 - increased awareness of children's needs, increased level of attachment and engagement             Goal 3) For interpersonal deficits, help the client develop new interpersonal skills and relationships.   5 Point Likert rating baseline date: 04/29/2021 Target Date Goal Was reviewed Status Code Progress towards goal/Likert rating  04/30/2023 04/28/2022           O 1/5 - pt continues to avoid social opportunities, makes little effort to mobilize around goal to improve interpersonal interactions outside of immediate family              Goal 4) Develop healthy interpersonal relationships that lead to the alleviation and help prevent  the relapse of depression.    5 Point Likert rating baseline date: 04/29/2021 Target Date Goal Was reviewed Status Code Progress towards goal/Likert rating  04/30/2023 04/28/2022          O 1/5 - pt continues to avoid social opportunities, makes little effort to mobilize around goal to improve interpersonal interactions outside of immediate family             This plan has been reviewed and created by the following participants:  This plan will be reviewed at least every 12 months. Date Behavioral Health Clinician Date Guardian/Patient   04/28/2022  [name], [name]   04/28/2022 Joel Mayer                    Diagnosis: Generalized Anxiety Disorder  Joel Mayer reports that the first anniversary of his father's death passed this past  week.  He shared that he had a good conversation with his mother about missing him.  We talked about his father, he shared some memories and made some connections between the present and the past.  Joel Mayer states that they planned a trip to [place] for the girls' Spring Break but their youngest got the flu.   We d/ some concerns about his daughter spending a few days with a friend down in [place] away from them and the need to d/ safety issues with her in order to "head off" any risky behavior.  He continues to go to the gym regularly and we d/ the positive differences he is beginning to notice.       [name], PhD   ========================================  Wife: [name] Daughters:  Maretta Bees - Freshman @ [employer] - 7th Grade @ [place]

## 2023-02-16 ENCOUNTER — Ambulatory Visit (INDEPENDENT_AMBULATORY_CARE_PROVIDER_SITE_OTHER): Payer: 59 | Admitting: Psychology

## 2023-02-16 DIAGNOSIS — F411 Generalized anxiety disorder: Secondary | ICD-10-CM | POA: Diagnosis not present

## 2023-02-16 NOTE — Progress Notes (Signed)
PROGRESS NOTE:  Name: Joel Mayer Date: 02/16/2023 MRN: [id number] DOB: September 18, 1976 PCP: Creola Corn, MD  Time spent: 8:00-8:55 AM   Annual Review: 04/30/2023  Today I met with  Joel Mayer in remote video (Caregility) face-to-face individual psychotherapy.   Distance Site: Client's Home Orginating Site: Dr Odette Horns Remote Office Consent: Obtained verbal consent to transmit session remotely    Presenting Problem: Joel Mayer states that in the Fall and winter he experiences a pattern of depression consistent with SAD.   While he feels that things are better, he would like some assistance and moving forward.  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  He has been prescribed Prozac 60 mg 17 years.  He was in therapy with Dr. Christin Fudge until her retirement.  Dr. Christin Fudge referred Pt to this therapist.  Pt is married to Joel Mayer (44) married since September of '07.  This is a first marriage for both of them. They have two daughters together. Pt reports periodic conflicts with wife and need to be more involved with his daughters.  Mental Status Exam:  Appearance: Casual    Behavior: Appropriate Motor: Normal Speech/Language: Normal Rate Affect: Appropriate Mood: normal Thought process: normal Thought content: WNL Sensory/Perceptual disturbances: WNL Orientation: oriented to person Attention: Good Concentration: Good Memory: Immediate;   Good Fund of knowledge: Good Insight: Good Judgment: Good Impulse Control: Good    Individualized Treatment Plan                Strengths: Intelligent, resourceful, loyal, good problem solver, persistent, deals with adversity, friendly, humorous  Supports: spouse, friends (mostly out of town), mother   Goal/Needs for Treatment:  In order of importance to patient 1) Learn and implement mindfulness techniques for the purposes of relapse prevention. 2) Increased skills, effectiveness, and confidence in parenting 3) For interpersonal deficits, help the  client develop new interpersonal skills and relationships.  4) Develop healthy interpersonal relationships that lead to the alleviation and help prevent  the relapse of depression.   Client Statement of Needs: Requires assistance of being more aware and mindful in his day to day life experience especially as his mood and behaviors impact his relationships with his wife and children.  A focus on relapse prevention related to depression and maintenance, .   Treatment Level: Biweekly Individual Outpatient Psychotherapy  Symptoms:  History of chronic or recurrent depression for which the client takes antidepressant medication, diminished interest in or enjoyment of activities, hypersensitivity to the criticism or disapproval of others, Reluctant involvement in social situations out of fear of saying or doing something foolish or of becoming emotional in front of others, reports difficulty in managing the challenging problem behavior of their child, social withdrawal. Displays deficits in parenting knowledge and skills and lacks knowledge regarding reasonable expectations for a child's behavior at a given developmental level.  [name].  Client Treatment Preferences: continue treatment with current therapist   [name]:  Psychologist, Joel Mayer, [name] will support the patient's ability to achieve the goals identified. Cognitive Behavioral Therapy, Dialectical Behavioral Therapy, Motivational Interviewing, behavior activation, parent training, and other evidenced-based practices will be used to promote progress towards healthy functioning.   [name] will: Actively participate in therapy, working towards healthy functioning.    *Justification for Continuation/Discontinuation of Goal: R=Revised, O=Ongoing, A=Achieved, D=Discontinued  Goal 1) Learn and implement mindfulness techniques for the purposes of relapse prevention.  5 Point  Likert rating baseline date: 04/29/2021 Target Date Goal Was  reviewed Status Code Progress towards goal/Likert rating  04/30/2023 04/28/2022          O 1/5 - pt has only a minimal level of the self awareness necessary to activate coping skills in a timely manner, unable to identify emotions             Goal 2) Increased skills, effectiveness, and confidence in parenting  5 Point Likert rating baseline date: 04/29/2021 Target Date Goal Was reviewed Status Code Progress towards goal/Likert rating  04/30/2023 04/28/2022          O 2/5 - increased awareness of children's needs, increased level of attachment and engagement             Goal 3) For interpersonal deficits, help the client develop new interpersonal skills and relationships.   5 Point Likert rating baseline date: 04/29/2021 Target Date Goal Was reviewed Status Code Progress towards goal/Likert rating  04/30/2023 04/28/2022           O 1/5 - pt continues to avoid social opportunities, makes little effort to mobilize around goal to improve interpersonal interactions outside of immediate family              Goal 4) Develop healthy interpersonal relationships that lead to the alleviation and help prevent  the relapse of depression.    5 Point Likert rating baseline date: 04/29/2021 Target Date Goal Was reviewed Status Code Progress towards goal/Likert rating  04/30/2023 04/28/2022          O 1/5 - pt continues to avoid social opportunities, makes little effort to mobilize around goal to improve interpersonal interactions outside of immediate family             This plan has been reviewed and created by the following participants:  This plan will be reviewed at least every 12 months. Date Behavioral Health Clinician Date Guardian/Patient   04/28/2022  Joel Mayer, [name]   04/28/2022 Joel Mayer                    Diagnosis: Generalized Anxiety Disorder  Joel Mayer reports that the family had a great time on vacation.  He states  that for the first time he used the fitness center of their resort.  He is starting to see the differences and is happy with himself.  Joel Mayer's main concern is that he will have a hard time maintaining his routine.  We d/e his concerns.  I provided some psycho education about creating, maintaining habits and how he might apply this knowledge.  Lastly, Joel Mayer noted that his mother seems to be at a different point in her grief process.  We d/e/p his thoughts about his father's loss and how he has seen his mother's response to the loss of his father.   Joel Favors, PhD   ========================================  Wife: Joel Mayer Daughters:  Joel Mayer - Freshman @ [employer] - 7th Grade @ [address]

## 2023-03-02 ENCOUNTER — Ambulatory Visit: Payer: 59 | Admitting: Psychology

## 2023-03-16 ENCOUNTER — Ambulatory Visit: Payer: 59 | Admitting: Psychology

## 2023-03-30 ENCOUNTER — Ambulatory Visit (INDEPENDENT_AMBULATORY_CARE_PROVIDER_SITE_OTHER): Payer: 59 | Admitting: Psychology

## 2023-03-30 DIAGNOSIS — F411 Generalized anxiety disorder: Secondary | ICD-10-CM | POA: Diagnosis not present

## 2023-03-30 NOTE — Progress Notes (Signed)
PROGRESS NOTE:  Name: Joel Mayer Date: 03/30/2023 MRN: [id number] DOB: 1976/08/02 PCP: Creola Corn, MD  Time spent: 8:00-8:55 AM   Annual Review: 04/30/2023  Today I met with  [name] in remote video (Caregility) face-to-face individual psychotherapy.   Distance Site: Client's Home Orginating Site: Dr Odette Horns Remote Office Consent: Obtained verbal consent to transmit session remotely    Presenting Problem: Joel Mayer states that in the Fall and winter he experiences a pattern of depression consistent with SAD.   While he feels that things are better, he would like some assistance and moving forward.  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  He has been prescribed Prozac 60 mg 17 years.  He was in therapy with Dr. Christin Fudge until her retirement.  Dr. Christin Fudge referred Pt to this therapist.  Pt is married to Dunlap (44) married since September of '07.  This is a first marriage for both of them. They have two daughters together. Pt reports periodic conflicts with wife and need to be more involved with his daughters.  Mental Status Exam:  Appearance: Casual    Behavior: Appropriate Motor: Normal Speech/Language: Normal Rate Affect: Appropriate Mood: normal Thought process: normal Thought content: WNL Sensory/Perceptual disturbances: WNL Orientation: oriented to person Attention: Good Concentration: Good Memory: Immediate;   Good Fund of knowledge: Good Insight: Good Judgment: Good Impulse Control: Good    Individualized Treatment Plan                Strengths: Intelligent, resourceful, loyal, good problem solver, persistent, deals with adversity, friendly, humorous  Supports: spouse, friends (mostly out of town), mother   Goal/Needs for Treatment:  In order of importance to patient 1) Learn and implement mindfulness techniques for the purposes of relapse prevention. 2) Increased skills, effectiveness, and confidence in parenting 3) For interpersonal deficits, help the  client develop new interpersonal skills and relationships.  4) Develop healthy interpersonal relationships that lead to the alleviation and help prevent  the relapse of depression.   Client Statement of Needs: Requires assistance of being more aware and mindful in his day to day life experience especially as his mood and behaviors impact his relationships with his wife and children.  A focus on relapse prevention related to depression and maintenance, .   Treatment Level: Biweekly Individual Outpatient Psychotherapy  Symptoms:  History of chronic or recurrent depression for which the client takes antidepressant medication, diminished interest in or enjoyment of activities, hypersensitivity to the criticism or disapproval of others, Reluctant involvement in social situations out of fear of saying or doing something foolish or of becoming emotional in front of others, reports difficulty in managing the challenging problem behavior of their child, social withdrawal. Displays deficits in parenting knowledge and skills and lacks knowledge regarding reasonable expectations for a child's behavior at a given developmental level.  [name].  Client Treatment Preferences: continue treatment with current therapist   [name]:  Psychologist, Hilma Favors, [name] will support the patient's ability to achieve the goals identified. Cognitive Behavioral Therapy, Dialectical Behavioral Therapy, Motivational Interviewing, behavior activation, parent training, and other evidenced-based practices will be used to promote progress towards healthy functioning.   Healthcare consumer Joel "Joel Mayer" Ishaq will: Actively participate in therapy, working towards healthy functioning.    *Justification for Continuation/Discontinuation of Goal: R=Revised, O=Ongoing, A=Achieved, D=Discontinued  Goal 1) Learn and implement mindfulness techniques for the purposes of relapse prevention.  5 Point  Likert rating baseline date: 04/29/2021 Target Date Goal Was  reviewed Status Code Progress towards goal/Likert rating  04/30/2023 04/28/2022          O 1/5 - pt has only a minimal level of the self awareness necessary to activate coping skills in a timely manner, unable to identify emotions             Goal 2) Increased skills, effectiveness, and confidence in parenting  5 Point Likert rating baseline date: 04/29/2021 Target Date Goal Was reviewed Status Code Progress towards goal/Likert rating  04/30/2023 04/28/2022          O 2/5 - increased awareness of children's needs, increased level of attachment and engagement             Goal 3) For interpersonal deficits, help the client develop new interpersonal skills and relationships.   5 Point Likert rating baseline date: 04/29/2021 Target Date Goal Was reviewed Status Code Progress towards goal/Likert rating  04/30/2023 04/28/2022           O 1/5 - pt continues to avoid social opportunities, makes little effort to mobilize around goal to improve interpersonal interactions outside of immediate family              Goal 4) Develop healthy interpersonal relationships that lead to the alleviation and help prevent  the relapse of depression.    5 Point Likert rating baseline date: 04/29/2021 Target Date Goal Was reviewed Status Code Progress towards goal/Likert rating  04/30/2023 04/28/2022          O 1/5 - pt continues to avoid social opportunities, makes little effort to mobilize around goal to improve interpersonal interactions outside of immediate family             This plan has been reviewed and created by the following participants:  This plan will be reviewed at least every 12 months. Date Behavioral Health Clinician Date Guardian/Patient   04/28/2022  Hilma Favors, [name]   04/28/2022 Milly Jakob" Ellner                    Diagnosis: Generalized Anxiety Disorder  Joel Mayer reports that the family was "insanely busy" the month of April, he  is totally off his routines.  He shared the numerous activities and feeling stressed to keep up.  I urged him to allow himself to say no occasionally to opportunities when there are so many offerings.  I noted that it can be hard to "teach" children limits and many times its up to them to say no.    Joel Mayer shared a situation he is having with his mother that has him frustrated and angry.  We p/v his concerns, feelings and provided the support needed.  We then were able to d/e how to move forward and not just act reactively.  By the end of the session, Joel Mayer felt supported and had an action plan.   Hilma Favors, PhD   ========================================  Wife: Herbert Seta Daughters:  Deloria Lair - Freshman @ [employer] - 7th Grade @ [address]

## 2023-04-13 ENCOUNTER — Ambulatory Visit (INDEPENDENT_AMBULATORY_CARE_PROVIDER_SITE_OTHER): Payer: 59 | Admitting: Psychology

## 2023-04-13 DIAGNOSIS — F411 Generalized anxiety disorder: Secondary | ICD-10-CM
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
## 2023-04-27 ENCOUNTER — Ambulatory Visit: Payer: 59 | Admitting: Psychology

## 2023-05-11 ENCOUNTER — Ambulatory Visit (INDEPENDENT_AMBULATORY_CARE_PROVIDER_SITE_OTHER): Payer: 59 | Admitting: Psychology

## 2023-05-11 DIAGNOSIS — F3341 Major depressive disorder, recurrent, in partial remission: Secondary | ICD-10-CM | POA: Diagnosis not present

## 2023-05-11 DIAGNOSIS — F411 Generalized anxiety disorder: Secondary | ICD-10-CM

## 2023-05-11 NOTE — Progress Notes (Addendum)
PROGRESS NOTE: Annual Review  Name: Joel Mayer Date: 05/11/2023 MRN: [id number] DOB: 06-17-1976 PCP: Creola Corn, MD  Time spent: 8:00-8:55 AM   Annual Review: 04/30/2023  Today I met with  Joel Mayer in remote video (Caregility) face-to-face individual psychotherapy.   Distance Site: Client's Home Orginating Site: Dr Odette Horns Remote Office Consent: Obtained verbal consent to transmit session remotely.  Patient is aware of the inherent limitations in participating in virtual therapy.   Presenting Problem: Joel Mayer states that in the Fall and winter he experiences a pattern of depression consistent with SAD.   While he feels that things are better, he would like some assistance and moving forward.  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  He has been prescribed Prozac 60 mg 17 years.  He was in therapy with Dr. Christin Fudge until her retirement.  Dr. Christin Fudge referred Pt to this therapist.  Pt is married to Joel Mayer (44) married since September of '07.  This is a first marriage for both of them. They have two daughters together. Pt reports periodic conflicts with wife and need to be more involved with his daughters.  Mental Status Exam:  Appearance: Casual    Behavior: Appropriate Motor: Normal Speech/Language: Normal Rate Affect: Appropriate Mood: normal Thought process: normal Thought content: WNL Sensory/Perceptual disturbances: WNL Orientation: oriented to person Attention: Good Concentration: Good Memory: Immediate;   Good Fund of knowledge: Good Insight: Good Judgment: Good Impulse Control: Good    Individualized Treatment Plan                Strengths: Intelligent, resourceful, loyal, good problem solver, persistent, deals with adversity, friendly, humorous  Supports: spouse, friends (mostly out of town), mother   Goal/Needs for Treatment:  In order of importance to patient 1) Learn and implement mindfulness techniques for the purposes of relapse prevention. 2) Increased  skills, effectiveness, and confidence in parenting 3) For interpersonal deficits, help the client develop new interpersonal skills and relationships.  4) Develop healthy interpersonal relationships that lead to the alleviation and help prevent  the relapse of depression.   Client Statement of Needs: Requires assistance of being more aware and mindful in his day to day life experience especially as his mood and behaviors impact his relationships with his wife and children.  A focus on relapse prevention related to depression and maintenance, .   Treatment Level: Biweekly Individual Outpatient Psychotherapy  Symptoms:  History of chronic or recurrent depression for which the client takes antidepressant medication, diminished interest in or enjoyment of activities, hypersensitivity to the criticism or disapproval of others, Reluctant involvement in social situations out of fear of saying or doing something foolish or of becoming emotional in front of others, reports difficulty in managing the challenging problem behavior of their child, social withdrawal. Displays deficits in parenting knowledge and skills and lacks knowledge regarding reasonable expectations for a child's behavior at a given developmental level.  [name].  Client Treatment Preferences: continue treatment with current therapist   [name]:  Psychologist, Hilma Favors, [name] will support the patient's ability to achieve the goals identified. Cognitive Behavioral Therapy, Dialectical Behavioral Therapy, Motivational Interviewing, behavior activation, parent training, and other evidenced-based practices will be used to promote progress towards healthy functioning.   [name] will: Actively participate in therapy, working towards healthy functioning.    *Justification for Continuation/Discontinuation of Goal: R=Revised, O=Ongoing, A=Achieved, D=Discontinued  Goal 1)  Learn and implement mindfulness techniques for the purposes of relapse  prevention.  5 Point Likert rating baseline date: 04/29/2021 Target Date Goal Was reviewed Status Code Progress towards goal/Likert rating  04/30/2023 04/28/2022          O 1/5 - pt has only a minimal level of the self awareness necessary to activate coping skills in a timely manner, unable to identify emotions  05/10/2024 05/11/2023          O 2/5 - pt demonstrates minimal progress, shows little self awareness necessary to activate coping skills in a timely manner, unable to identify emotions        Goal 2) Increased skills, effectiveness, and confidence in parenting  5 Point Likert rating baseline date: 04/29/2021 Target Date Goal Was reviewed Status Code Progress towards goal/Likert rating  04/30/2023 04/28/2022          O 2/5 - increased awareness of children's needs, increased level of attachment and engagement  05/10/2024 05/11/2023          O 4/5 - demonstrated the most progress in feeling more effective and engaged as a parent        Goal 3) For interpersonal deficits, help the client develop new interpersonal skills and relationships.   5 Point Likert rating baseline date: 04/29/2021 Target Date Goal Was reviewed Status Code Progress towards goal/Likert rating  04/30/2023 04/28/2022           O 1/5 - pt continues to avoid social opportunities, makes little effort to mobilize around goal to improve interpersonal interactions outside of immediate family  05/10/2024 05/11/2023           O 1/5 - pt made no progress, see need to focus on this area of his life this year         Goal 4) Develop healthy interpersonal relationships that lead to the alleviation and help prevent  the relapse of depression.    5 Point Likert rating baseline date: 04/29/2021 Target Date Goal Was reviewed Status Code Progress towards goal/Likert rating  04/30/2023 04/28/2022          O 1/5 - pt continues to avoid social opportunities, makes little effort to  mobilize around goal to improve interpersonal interactions outside of immediate family  05/10/2024 05/11/2023          O 1/5 - pt made no progress, see need to focus on this area of his life this year        This plan has been reviewed and created by the following participants:  This plan will be reviewed at least every 12 months. Date Behavioral Health Clinician Date Guardian/Patient   04/28/2022  Hilma Favors, [name]   04/28/2022 Joel Mayer  05/11/2023 Hilma Favors, [name]  05/11/2023 Joel Mayer               Diagnosis:  Generalized Anxiety Disorder Major Depressive Disorder, recurrent, in partial remission   In session today, conducted pt's annual review.  We reviewed Craig's progress, d/ goals and updated her treatment plan.  Joel Mayer actively participated in the creation of her treatment plan and freely gave his consent.  Joel Mayer reports that the family had a lovely vacation at the beach.  He is see that his work on being more engaged and present with his daughters has made a difference this year.  He also see how this year he needs to focus more on his own personal development particularly in the area of building social connections with friends and peers.  Hilma Favors,  PhD   ========================================  Wife: Herbert Seta Daughters:  [name] - [place] @ [employer] - 8th Grade @ [address]

## 2023-05-25 ENCOUNTER — Ambulatory Visit: Payer: 59 | Admitting: Psychology

## 2023-05-25 DIAGNOSIS — F3341 Major depressive disorder, recurrent, in partial remission: Secondary | ICD-10-CM | POA: Diagnosis not present

## 2023-05-25 DIAGNOSIS — F411 Generalized anxiety disorder: Secondary | ICD-10-CM | POA: Diagnosis not present

## 2023-05-25 NOTE — Progress Notes (Signed)
PROGRESS NOTE:  Name: Joel Mayer Date: 05/25/2023 MRN: [id number] DOB: 1976-07-14 PCP: Creola Corn, MD  Time spent: 8:00-8:55 AM   Annual Review: 05/10/2024  Today I met with  Joel Mayer in remote video (Caregility) face-to-face individual psychotherapy.   Distance Site: Client's Home Orginating Site: Dr Odette Horns Remote Office Consent: Obtained verbal consent to transmit session remotely.   Patient is aware of the inherent limitations in participating in virtual therapy.   Presenting Problem: Joel Mayer states that in the Fall and winter he experiences a pattern of depression consistent with SAD.   While he feels that things are better, he would like some assistance and moving forward.  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  He has been prescribed Prozac 60 mg 17 years.  He was in therapy with Dr. Christin Fudge until her retirement.  Dr. Christin Fudge referred Pt to this therapist.  Pt is married to [place] (44) married since September of '07.  This is a first marriage for both of them. They have two daughters together. Pt reports periodic conflicts with wife and need to be more involved with his daughters.  Mental Status Exam:  Appearance: Casual    Behavior: Appropriate Motor: Normal Speech/Language: Normal Rate Affect: Appropriate Mood: normal Thought process: normal Thought content: WNL Sensory/Perceptual disturbances: WNL Orientation: oriented to person Attention: Good Concentration: Good Memory: Immediate;   Good Fund of knowledge: Good Insight: Good Judgment: Good Impulse Control: Good    Individualized Treatment Plan                Strengths: Intelligent, resourceful, loyal, good problem solver, persistent, deals with adversity, friendly, humorous  Supports: spouse, friends (mostly out of town), mother   Goal/Needs for Treatment:  In order of importance to patient 1) Learn and implement mindfulness techniques for the purposes of relapse prevention. 2) Increased skills,  effectiveness, and confidence in parenting 3) For interpersonal deficits, help the client develop new interpersonal skills and relationships.  4) Develop healthy interpersonal relationships that lead to the alleviation and help prevent  the relapse of depression.   Client Statement of Needs: Requires assistance of being more aware and mindful in his day to day life experience especially as his mood and behaviors impact his relationships with his wife and children.  A focus on relapse prevention related to depression and maintenance, .   Treatment Level: Biweekly Individual Outpatient Psychotherapy  Symptoms:  History of chronic or recurrent depression for which the client takes antidepressant medication, diminished interest in or enjoyment of activities, hypersensitivity to the criticism or disapproval of others, Reluctant involvement in social situations out of fear of saying or doing something foolish or of becoming emotional in front of others, reports difficulty in managing the challenging problem behavior of their child, social withdrawal. Displays deficits in parenting knowledge and skills and lacks knowledge regarding reasonable expectations for a child's behavior at a given developmental level.  [name].  Client Treatment Preferences: continue treatment with current therapist   [name]:  Psychologist, Hilma Favors, [name] will support the patient's ability to achieve the goals identified. Cognitive Behavioral Therapy, Dialectical Behavioral Therapy, Motivational Interviewing, behavior activation, parent training, and other evidenced-based practices will be used to promote progress towards healthy functioning.   [name] will: Actively participate in therapy, working towards healthy functioning.    *Justification for Continuation/Discontinuation of Goal: R=Revised, O=Ongoing, A=Achieved, D=Discontinued  Goal 1) Learn and  implement mindfulness techniques for the purposes of relapse prevention.  5 Point Likert rating baseline date: 04/29/2021 Target Date Goal Was reviewed Status Code Progress towards goal/Likert rating  04/30/2023 04/28/2022          O 1/5 - pt has only a minimal level of the self awareness necessary to activate coping skills in a timely manner, unable to identify emotions  05/10/2024 05/11/2023          O 2/5 - pt demonstrates minimal progress, shows little self awareness necessary to activate coping skills in a timely manner, unable to identify emotions        Goal 2) Increased skills, effectiveness, and confidence in parenting  5 Point Likert rating baseline date: 04/29/2021 Target Date Goal Was reviewed Status Code Progress towards goal/Likert rating  04/30/2023 04/28/2022          O 2/5 - increased awareness of children's needs, increased level of attachment and engagement  05/10/2024 05/11/2023          O 4/5 - demonstrated the most progress in feeling more effective and engaged as a parent        Goal 3) For interpersonal deficits, help the client develop new interpersonal skills and relationships.   5 Point Likert rating baseline date: 04/29/2021 Target Date Goal Was reviewed Status Code Progress towards goal/Likert rating  04/30/2023 04/28/2022           O 1/5 - pt continues to avoid social opportunities, makes little effort to mobilize around goal to improve interpersonal interactions outside of immediate family  05/10/2024 05/11/2023           O 1/5 - pt made no progress, see need to focus on this area of his life this year        Goal 4) Develop healthy interpersonal relationships that lead to the alleviation and help prevent  the relapse of depression.    5 Point Likert rating baseline date: 04/29/2021 Target Date Goal Was reviewed Status Code Progress towards goal/Likert rating  04/30/2023 04/28/2022          O 1/5 - pt continues to avoid social opportunities, makes little effort to mobilize around  goal to improve interpersonal interactions outside of immediate family  05/10/2024 05/11/2023          O 1/5 - pt made no progress, see need to focus on this area of his life this year        This plan has been reviewed and created by the following participants:  This plan will be reviewed at least every 12 months. Date Behavioral Health Clinician Date Guardian/Patient   04/28/2022  Hilma Favors, [name]   04/28/2022 Joel Mayer  05/11/2023 Hilma Favors, [name]  05/11/2023 Joel Mayer               Diagnosis:  Generalized Anxiety Disorder Major Depressive Disorder, recurrent, in partial remission   Joel Mayer reports that they dropped off their youngest at camp yesterday.  She got off to a bad start and will be away for two weeks.  We d/e/p his own reactions and feelings of identification with his daughter.  We were then able to d/e/p his own early experiences , make connections between the past and the present and allow for some much needed emotional release.  This interaction represents a huge psychological break through for Joel Mayer.  I provided some psycho education on emotion regulation and co-regulation and taught him an emotion regulation coping skill to prevent his feeling overwhelmed and prematurely cutting off  his own emotional response.   Hilma Favors, PhD   ========================================  Wife: Herbert Seta Daughters:  [name] - Sophmore @ [employer] - 8th Grade @ [address]

## 2023-06-08 ENCOUNTER — Ambulatory Visit: Payer: 59 | Admitting: Psychology

## 2023-06-08 DIAGNOSIS — F411 Generalized anxiety disorder: Secondary | ICD-10-CM

## 2023-06-08 DIAGNOSIS — F3341 Major depressive disorder, recurrent, in partial remission: Secondary | ICD-10-CM

## 2023-06-08 NOTE — Progress Notes (Signed)
PROGRESS NOTE:  Name: Joel Mayer Date: 06/08/2023 MRN: [id number] DOB: [birth year] PCP: Creola Corn, MD  Time spent: 8:00-8:55 AM   Annual Review: 05/10/2024  Today I met with  Joel Mayer in remote video (Caregility) face-to-face individual psychotherapy.   Distance Site: Client's Home Orginating Site: Dr Odette Horns Remote Office Consent: Obtained verbal consent to transmit session remotely.   Patient is aware of the inherent limitations in participating in virtual therapy.   Presenting Problem: Joel Mayer states that in the Fall and winter he experiences a pattern of depression consistent with SAD.   While he feels that things are better, he would like some assistance and moving forward.  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  He has been prescribed Prozac 60 mg 17 years.  He was in therapy with Dr. Christin Fudge until her retirement.  Dr. Christin Fudge referred Pt to this therapist.  Pt is married to [place] (44) married since September of '07.  This is a first marriage for both of them. They have two daughters together. Pt reports periodic conflicts with wife and need to be more involved with his daughters.  Mental Status Exam:  Appearance: Casual    Behavior: Appropriate Motor: Normal Speech/Language: Normal Rate Affect: Appropriate Mood: normal Thought process: normal Thought content: WNL Sensory/Perceptual disturbances: WNL Orientation: oriented to person Attention: Good Concentration: Good Memory: Immediate;   Good Fund of knowledge: Good Insight: Good Judgment: Good Impulse Control: Good    Individualized Treatment Plan                Strengths: Intelligent, resourceful, loyal, good problem solver, persistent, deals with adversity, friendly, humorous  Supports: spouse, friends (mostly out of town), mother   Goal/Needs for Treatment:  In order of importance to patient 1) Learn and implement mindfulness techniques for the purposes of relapse prevention. 2) Increased skills,  effectiveness, and confidence in parenting 3) For interpersonal deficits, help the client develop new interpersonal skills and relationships.  4) Develop healthy interpersonal relationships that lead to the alleviation and help prevent  the relapse of depression.   Client Statement of Needs: Requires assistance of being more aware and mindful in his day to day life experience especially as his mood and behaviors impact his relationships with his wife and children.  A focus on relapse prevention related to depression and maintenance, .   Treatment Level: Biweekly Individual Outpatient Psychotherapy  Symptoms:  History of chronic or recurrent depression for which the client takes antidepressant medication, diminished interest in or enjoyment of activities, hypersensitivity to the criticism or disapproval of others, Reluctant involvement in social situations out of fear of saying or doing something foolish or of becoming emotional in front of others, reports difficulty in managing the challenging problem behavior of their child, social withdrawal. Displays deficits in parenting knowledge and skills and lacks knowledge regarding reasonable expectations for a child's behavior at a given developmental level.  [name].  Client Treatment Preferences: continue treatment with current therapist   [name]:  Psychologist, Hilma Favors, [name] will support the patient's ability to achieve the goals identified. Cognitive Behavioral Therapy, Dialectical Behavioral Therapy, Motivational Interviewing, behavior activation, parent training, and other evidenced-based practices will be used to promote progress towards healthy functioning.   [name] will: Actively participate in therapy, working towards healthy functioning.    *Justification for Continuation/Discontinuation of Goal: R=Revised, O=Ongoing, A=Achieved, D=Discontinued  Goal 1) Learn and  implement mindfulness techniques for the purposes of relapse prevention.  5 Point Likert rating baseline date: 04/29/2021 Target Date Goal Was reviewed Status Code Progress towards goal/Likert rating  04/30/2023 04/28/2022          O 1/5 - pt has only a minimal level of the self awareness necessary to activate coping skills in a timely manner, unable to identify emotions  05/10/2024 05/11/2023          O 2/5 - pt demonstrates minimal progress, shows little self awareness necessary to activate coping skills in a timely manner, unable to identify emotions        Goal 2) Increased skills, effectiveness, and confidence in parenting  5 Point Likert rating baseline date: 04/29/2021 Target Date Goal Was reviewed Status Code Progress towards goal/Likert rating  04/30/2023 04/28/2022          O 2/5 - increased awareness of children's needs, increased level of attachment and engagement  05/10/2024 05/11/2023          O 4/5 - demonstrated the most progress in feeling more effective and engaged as a parent        Goal 3) For interpersonal deficits, help the client develop new interpersonal skills and relationships.   5 Point Likert rating baseline date: 04/29/2021 Target Date Goal Was reviewed Status Code Progress towards goal/Likert rating  04/30/2023 04/28/2022           O 1/5 - pt continues to avoid social opportunities, makes little effort to mobilize around goal to improve interpersonal interactions outside of immediate family  05/10/2024 05/11/2023           O 1/5 - pt made no progress, see need to focus on this area of his life this year        Goal 4) Develop healthy interpersonal relationships that lead to the alleviation and help prevent  the relapse of depression.    5 Point Likert rating baseline date: 04/29/2021 Target Date Goal Was reviewed Status Code Progress towards goal/Likert rating  04/30/2023 04/28/2022          O 1/5 - pt continues to avoid social opportunities, makes little effort to mobilize around  goal to improve interpersonal interactions outside of immediate family  05/10/2024 05/11/2023          O 1/5 - pt made no progress, see need to focus on this area of his life this year        This plan has been reviewed and created by the following participants:  This plan will be reviewed at least every 12 months. Date Behavioral Health Clinician Date Guardian/Patient   04/28/2022  Hilma Favors, [name]   04/28/2022 Joel Mayer  05/11/2023 Hilma Favors, [name]  05/11/2023 Joel Mayer               Diagnosis:  Generalized Anxiety Disorder Major Depressive Disorder, recurrent, in partial remission   Joel Mayer reports that he has been very emotional these past two weeks.  He states that he has been on the verge of "breaking down" but he just can't go over the edge.  I made some inquires, questioned his felt need to "go over the edge" and labeled his dissociative behavior.  We were then able to d/ his fear of getting overwhelmed and not being able to handle it.  We d/e/p unprocessed grief over the loss of his father, the unfairness of losing him "instead of his mother" and feelings of resentment toward his mother.  We made some connections between the past  and the present related to his having been "emotionally neglected" and difficulties making new friends.  I noted that perhaps he needed to start  "putting in the effort" with his three oldest and dearest friends instead, to get past his social anxiety and build his confidence.  He agreed to continue to reflect and journal on the themes we d/ today.  Home Practice:  daily journaling on prompts d/ in therapy  Hilma Favors, PhD   ========================================  Wife: Herbert Seta Daughters:  [name] - Soph[place] @ [employer] - 8th Grade @ [address]

## 2023-06-22 ENCOUNTER — Ambulatory Visit: Payer: 59 | Admitting: Psychology

## 2023-06-25 ENCOUNTER — Ambulatory Visit (INDEPENDENT_AMBULATORY_CARE_PROVIDER_SITE_OTHER): Payer: 59 | Admitting: Psychology

## 2023-06-25 DIAGNOSIS — F320 Major depressive disorder, single episode, mild: Secondary | ICD-10-CM | POA: Diagnosis not present

## 2023-06-25 NOTE — Progress Notes (Addendum)
PROGRESS NOTE:  Name: Joel Mayer Date: 06/25/2023 MRN: [id number] DOB: [birth year] PCP: Creola Corn, MD  Time spent: 7:82NF - 5:02PM   Annual Review: 05/10/2024   Today I met with Joel Mayer  for in office face-to-face individual psychotherapy.    Presenting Problem: Joel Mayer states that in the Fall and winter he experiences a pattern of depression consistent with SAD.   While he feels that things are better, he would like some assistance and moving forward.  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  He has been prescribed Prozac 60 mg 17 years.  He was in therapy with Dr. Christin Mayer until her retirement.  Dr. Christin Mayer referred Pt to this therapist.  Pt is married to Joel Mayer (44) married since September of '07.  This is a first marriage for both of them. They have two daughters together. Pt reports periodic conflicts with wife and need to be more involved with his daughters.  Mental Status Exam:  Appearance: Casual    Behavior: Appropriate Motor: Normal Speech/Language: Normal Rate Affect: Appropriate Mood: normal Thought process: normal Thought content: WNL Sensory/Perceptual disturbances: WNL Orientation: oriented to person Attention: Good Concentration: Good Memory: Immediate;   Good Fund of knowledge: Good Insight: Good Judgment: Good Impulse Control: Good    Individualized Treatment Plan                Strengths: Intelligent, resourceful, loyal, good problem solver, persistent, deals with adversity, friendly, humorous  Supports: spouse, friends (mostly out of town), mother   Goal/Needs for Treatment:  In order of importance to patient 1) Learn and implement mindfulness techniques for the purposes of relapse prevention. 2) Increased skills, effectiveness, and confidence in parenting 3) For interpersonal deficits, help the client develop new interpersonal skills and relationships.  4) Develop healthy interpersonal relationships that lead to the alleviation and help prevent   the relapse of depression.   Client Statement of Needs: Requires assistance of being more aware and mindful in his day to day life experience especially as his mood and behaviors impact his relationships with his wife and children.  A focus on relapse prevention related to depression and maintenance, .   Treatment Level: Biweekly Individual Outpatient Psychotherapy  Symptoms:  History of chronic or recurrent depression for which the client takes antidepressant medication, diminished interest in or enjoyment of activities, hypersensitivity to the criticism or disapproval of others, Reluctant involvement in social situations out of fear of saying or doing something foolish or of becoming emotional in front of others, reports difficulty in managing the challenging problem behavior of their child, social withdrawal. Displays deficits in parenting knowledge and skills and lacks knowledge regarding reasonable expectations for a child's behavior at a given developmental level.  [name].  Client Treatment Preferences: continue treatment with current therapist   [name]:  Psychologist, Joel Mayer, [name] will support the patient's ability to achieve the goals identified. Cognitive Behavioral Therapy, Dialectical Behavioral Therapy, Motivational Interviewing, behavior activation, parent training, and other evidenced-based practices will be used to promote progress towards healthy functioning.   [name] will: Actively participate in therapy, working towards healthy functioning.    *Justification for Continuation/Discontinuation of Goal: R=Revised, O=Ongoing, A=Achieved, D=Discontinued  Goal 1) Learn and implement mindfulness techniques for the purposes of relapse prevention.  5 Point Likert rating baseline date: 04/29/2021 Target Date Goal Was reviewed Status Code Progress towards goal/Likert rating  04/30/2023 04/28/2022          O 1/5  -  pt has only a minimal level of the self awareness necessary to activate coping skills in a timely manner, unable to identify emotions  05/10/2024 05/11/2023          O 2/5 - pt demonstrates minimal progress, shows little self awareness necessary to activate coping skills in a timely manner, unable to identify emotions        Goal 2) Increased skills, effectiveness, and confidence in parenting  5 Point Likert rating baseline date: 04/29/2021 Target Date Goal Was reviewed Status Code Progress towards goal/Likert rating  04/30/2023 04/28/2022          O 2/5 - increased awareness of children's needs, increased level of attachment and engagement  05/10/2024 05/11/2023          O 4/5 - demonstrated the most progress in feeling more effective and engaged as a parent        Goal 3) For interpersonal deficits, help the client develop new interpersonal skills and relationships.   5 Point Likert rating baseline date: 04/29/2021 Target Date Goal Was reviewed Status Code Progress towards goal/Likert rating  04/30/2023 04/28/2022           O 1/5 - pt continues to avoid social opportunities, makes little effort to mobilize around goal to improve interpersonal interactions outside of immediate family  05/10/2024 05/11/2023           O 1/5 - pt made no progress, see need to focus on this area of his life this year        Goal 4) Develop healthy interpersonal relationships that lead to the alleviation and help prevent  the relapse of depression.    5 Point Likert rating baseline date: 04/29/2021 Target Date Goal Was reviewed Status Code Progress towards goal/Likert rating  04/30/2023 04/28/2022          O 1/5 - pt continues to avoid social opportunities, makes little effort to mobilize around goal to improve interpersonal interactions outside of immediate family  05/10/2024 05/11/2023          O 1/5 - pt made no progress, see need to focus on this area of his life this year        This plan has been reviewed and created by the  following participants:  This plan will be reviewed at least every 12 months. Date Behavioral Health Clinician Date Guardian/Patient   04/28/2022  Joel Mayer, [name]   04/28/2022 Joel Mayer  05/11/2023 Joel Mayer, [name]  05/11/2023 Joel Mayer               Diagnosis:  Generalized Anxiety Disorder Major Depressive Disorder, recurrent, in partial remission   Joel Mayer reached out for an extra appointment after being unable to keep his scheduled appointment.  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  We identified contributing factors, and d/e/p the ways he has been using alcohol to numb and self medicate.  We d/p the ways in which it actually makes his depression worse and keeps him from utilizing his social supports.  We d/ the likelihood that the alcohol was working as an antagonist to his antidepressant and that it was good he has stopped drinking.  Joel Mayer came in with questions about possibly switching his medication.  I agreed that he should speak with his provider about his medications as well as requesting labs (Vitamin D & B12).  I provided (repeated) psycho education about supplements  that support good cognitive and mental health.  Lastly, he agreed to stay engaged with his family and to speak to them about ways the could support and encourage him to exercise.   Home Practice:  daily journaling on prompts d/ in therapy  Joel Favors, PhD   ========================================  Wife: Herbert Seta Daughters:  [name] - Soph[place] @ [employer] - 8th Grade @ [address]

## 2023-07-06 ENCOUNTER — Ambulatory Visit (INDEPENDENT_AMBULATORY_CARE_PROVIDER_SITE_OTHER): Payer: 59 | Admitting: Psychology

## 2023-07-06 DIAGNOSIS — F411 Generalized anxiety disorder: Secondary | ICD-10-CM

## 2023-07-06 DIAGNOSIS — F320 Major depressive disorder, single episode, mild: Secondary | ICD-10-CM

## 2023-07-06 DIAGNOSIS — F3341 Major depressive disorder, recurrent, in partial remission: Secondary | ICD-10-CM | POA: Diagnosis not present

## 2023-07-06 NOTE — Progress Notes (Signed)
PROGRESS NOTE:  Name: Larron Ernzen Date: 07/06/2023 MRN: [id number] DOB: October 04, 1976 PCP: Creola Corn, MD  Time spent: 8:05 AM - 9:02 AM   Annual Review: 05/10/2024   Today I met with  [name] in remote video (Caregility) face-to-face individual psychotherapy.  Pt experienced some technical difficulties and we switched to FaceTime.  Distance Site: Client's Home Orginating Site: Dr Odette Horns Remote Office Consent: Obtained verbal consent to transmit  session remotely     Presenting Problem: [name] states that in the Fall and winter he experiences a pattern of depression consistent with SAD.   While he feels that things are better, he would like some assistance and moving forward.  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  He has been prescribed Prozac 60 mg 17 years.  He was in therapy with Dr. Christin Fudge until her retirement.  Dr. Christin Fudge referred Pt to this therapist.  Pt is married to Mosinee (44) married since September of '07.  This is a first marriage for both of them. They have two daughters together. Pt reports periodic conflicts with wife and need to be more involved with his daughters.  Mental Status Exam:  Appearance: Casual    Behavior: Appropriate Motor: Normal Speech/Language: Normal Rate Affect: Appropriate Mood: normal Thought process: normal Thought content: WNL Sensory/Perceptual disturbances: WNL Orientation: oriented to person Attention: Good Concentration: Good Memory: Immediate;   Good Fund of knowledge: Good Insight: Good Judgment: Good Impulse Control: Good    Individualized Treatment Plan                Strengths: Intelligent, resourceful, loyal, good problem solver, persistent, deals with adversity, friendly, humorous  Supports: spouse, friends (mostly out of town), mother   Goal/Needs for Treatment:  In order of importance to patient 1) Learn and implement mindfulness techniques for the purposes of relapse prevention. 2) Increased skills,  effectiveness, and confidence in parenting 3) For interpersonal deficits, help the client develop new interpersonal skills and relationships.  4) Develop healthy interpersonal relationships that lead to the alleviation and help prevent  the relapse of depression.   Client Statement of Needs: Requires assistance of being more aware and mindful in his day to day life experience especially as his mood and behaviors impact his relationships with his wife and children.  A focus on relapse prevention related to depression and maintenance, .   Treatment Level: Biweekly Individual Outpatient Psychotherapy  Symptoms:  History of chronic or recurrent depression for which the client takes antidepressant medication, diminished interest in or enjoyment of activities, hypersensitivity to the criticism or disapproval of others, Reluctant involvement in social situations out of fear of saying or doing something foolish or of becoming emotional in front of others, reports difficulty in managing the challenging problem behavior of their child, social withdrawal. Displays deficits in parenting knowledge and skills and lacks knowledge regarding reasonable expectations for a child's behavior at a given developmental level.  [name].  Client Treatment Preferences: continue treatment with current therapist   [name]:  Psychologist, Hilma Favors, [name] will support the patient's ability to achieve the goals identified. Cognitive Behavioral Therapy, Dialectical Behavioral Therapy, Motivational Interviewing, behavior activation, parent training, and other evidenced-based practices will be used to promote progress towards healthy functioning.   Healthcare consumer Crew "[name]" Wunschel will: Actively participate in therapy, working towards healthy functioning.    *Justification for Continuation/Discontinuation of Goal: R=Revised, O=Ongoing, A=Achieved, D=Discontinued  Goal 1) Learn and  implement mindfulness techniques for the purposes  of relapse prevention.  5 Point Likert rating baseline date: 04/29/2021 Target Date Goal Was reviewed Status Code Progress towards goal/Likert rating  04/30/2023 04/28/2022          O 1/5 - pt has only a minimal level of the self awareness necessary to activate coping skills in a timely manner, unable to identify emotions  05/10/2024 05/11/2023          O 2/5 - pt demonstrates minimal progress, shows little self awareness necessary to activate coping skills in a timely manner, unable to identify emotions        Goal 2) Increased skills, effectiveness, and confidence in parenting  5 Point Likert rating baseline date: 04/29/2021 Target Date Goal Was reviewed Status Code Progress towards goal/Likert rating  04/30/2023 04/28/2022          O 2/5 - increased awareness of children's needs, increased level of attachment and engagement  05/10/2024 05/11/2023          O 4/5 - demonstrated the most progress in feeling more effective and engaged as a parent        Goal 3) For interpersonal deficits, help the client develop new interpersonal skills and relationships.   5 Point Likert rating baseline date: 04/29/2021 Target Date Goal Was reviewed Status Code Progress towards goal/Likert rating  04/30/2023 04/28/2022           O 1/5 - pt continues to avoid social opportunities, makes little effort to mobilize around goal to improve interpersonal interactions outside of immediate family  05/10/2024 05/11/2023           O 1/5 - pt made no progress, see need to focus on this area of his life this year        Goal 4) Develop healthy interpersonal relationships that lead to the alleviation and help prevent  the relapse of depression.    5 Point Likert rating baseline date: 04/29/2021 Target Date Goal Was reviewed Status Code Progress towards goal/Likert rating  04/30/2023 04/28/2022          O 1/5 - pt continues to avoid social opportunities, makes little effort to mobilize around  goal to improve interpersonal interactions outside of immediate family  05/10/2024 05/11/2023          O 1/5 - pt made no progress, see need to focus on this area of his life this year        This plan has been reviewed and created by the following participants:  This plan will be reviewed at least every 12 months. Date Behavioral Health Clinician Date Guardian/Patient   04/28/2022  Hilma Favors, [name]   04/28/2022 Mareon "[name]" Weinberg  05/11/2023 Hilma Favors, [name]  05/11/2023 Zadrian "[name]" Colao               Diagnosis:  Generalized Anxiety Disorder Major Depressive Disorder, recurrent, in partial remission   Last session, [name] came in with questions about possibly switching his medication.  He agreed to meet with his provider to discuss possible medication changes.  He was taken off of Prozac and put on Lexapro (10mg ).  [name] admitted that he forgot to request labs (Vitamin D & B12). He also started the supplements we discussed support good cognitive and mental health.  He agreed to speak to his family about ways the could support and encourage him to exercise.  Craig f/t and found that they were very supportive, he has done better with exercising and is already his sleeping  has improved.   Home Practice:  daily journaling on prompts d/ in therapy  Hilma Favors, PhD   ========================================  Wife: Herbert Seta Daughters:  [name] - Sophmore @ [employer] - 8th Grade @ [address]

## 2023-07-20 ENCOUNTER — Ambulatory Visit (INDEPENDENT_AMBULATORY_CARE_PROVIDER_SITE_OTHER): Payer: 59 | Admitting: Psychology

## 2023-07-20 DIAGNOSIS — F411 Generalized anxiety disorder: Secondary | ICD-10-CM

## 2023-07-20 DIAGNOSIS — F3341 Major depressive disorder, recurrent, in partial remission: Secondary | ICD-10-CM | POA: Diagnosis not present

## 2023-07-20 NOTE — Progress Notes (Signed)
PROGRESS NOTE:  Name: Joel Mayer Date: 07/20/2023 MRN: [id number] DOB: 1976/07/16 PCP: Creola Corn, MD  Time spent: 8:01 AM - 8:58 AM   Annual Review: 05/10/2024   Today I met with  Joel Mayer in remote video (Caregility) face-to-face individual psychotherapy.   Distance Site: Client's Home Orginating Site: Dr Odette Horns Remote Office Consent: Obtained verbal consent to transmit session remotely. Patient is aware of the inherent limitations in participating in virtual therapy.      Presenting Problem: Joel Mayer states that in the Fall and winter he experiences a pattern of depression consistent with SAD.   While he feels that things are better, he would like some assistance and moving forward.  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  He has been prescribed Prozac 60 mg 17 years.  He was in therapy with Dr. Christin Fudge until her retirement.  Dr. Christin Fudge referred Pt to this therapist.  Pt is married to [place] (45) married since September of '07.  This is a first marriage for both of them. They have two daughters together. Pt reports periodic conflicts with wife and need to be more involved with his daughters.  Mental Status Exam:  Appearance: Casual    Behavior: Appropriate Motor: Normal Speech/Language: Normal Rate Affect: Appropriate Mood: normal Thought process: normal Thought content: WNL Sensory/Perceptual disturbances: WNL Orientation: oriented to person Attention: Good Concentration: Good Memory: Immediate;   Good Fund of knowledge: Good Insight: Good Judgment: Good Impulse Control: Good    Individualized Treatment Plan                Strengths: Intelligent, resourceful, loyal, good problem solver, persistent, deals with adversity, friendly, humorous  Supports: spouse, friends (mostly out of town), mother   Goal/Needs for Treatment:  In order of importance to patient 1) Learn and implement mindfulness techniques for the purposes of relapse prevention. 2) Increased  skills, effectiveness, and confidence in parenting 3) For interpersonal deficits, help the client develop new interpersonal skills and relationships.  4) Develop healthy interpersonal relationships that lead to the alleviation and help prevent  the relapse of depression.   Client Statement of Needs: Requires assistance of being more aware and mindful in his day to day life experience especially as his mood and behaviors impact his relationships with his wife and children.  A focus on relapse prevention related to depression and maintenance, .   Treatment Level: Biweekly Individual Outpatient Psychotherapy  Symptoms:  History of chronic or recurrent depression for which the client takes antidepressant medication, diminished interest in or enjoyment of activities, hypersensitivity to the criticism or disapproval of others, Reluctant involvement in social situations out of fear of saying or doing something foolish or of becoming emotional in front of others, reports difficulty in managing the challenging problem behavior of their child, social withdrawal. Displays deficits in parenting knowledge and skills and lacks knowledge regarding reasonable expectations for a child's behavior at a given developmental level.  [name].  Client Treatment Preferences: continue treatment with current therapist   [name]:  Psychologist, Hilma Favors, [name] will support the patient's ability to achieve the goals identified. Cognitive Behavioral Therapy, Dialectical Behavioral Therapy, Motivational Interviewing, behavior activation, parent training, and other evidenced-based practices will be used to promote progress towards healthy functioning.   [name] will: Actively participate in therapy, working towards healthy functioning.    *Justification for Continuation/Discontinuation of Goal: R=Revised, O=Ongoing, A=Achieved, D=Discontinued  Goal 1)  Learn and implement mindfulness techniques for  the purposes of relapse prevention.  5 Point Likert rating baseline date: 04/29/2021 Target Date Goal Was reviewed Status Code Progress towards goal/Likert rating  04/30/2023 04/28/2022          O 1/5 - pt has only a minimal level of the self awareness necessary to activate coping skills in a timely manner, unable to identify emotions  05/10/2024 05/11/2023          O 2/5 - pt demonstrates minimal progress, shows little self awareness necessary to activate coping skills in a timely manner, unable to identify emotions        Goal 2) Increased skills, effectiveness, and confidence in parenting  5 Point Likert rating baseline date: 04/29/2021 Target Date Goal Was reviewed Status Code Progress towards goal/Likert rating  04/30/2023 04/28/2022          O 2/5 - increased awareness of children's needs, increased level of attachment and engagement  05/10/2024 05/11/2023          O 4/5 - demonstrated the most progress in feeling more effective and engaged as a parent        Goal 3) For interpersonal deficits, help the client develop new interpersonal skills and relationships.   5 Point Likert rating baseline date: 04/29/2021 Target Date Goal Was reviewed Status Code Progress towards goal/Likert rating  04/30/2023 04/28/2022           O 1/5 - pt continues to avoid social opportunities, makes little effort to mobilize around goal to improve interpersonal interactions outside of immediate family  05/10/2024 05/11/2023           O 1/5 - pt made no progress, see need to focus on this area of his life this year        Goal 4) Develop healthy interpersonal relationships that lead to the alleviation and help prevent  the relapse of depression.    5 Point Likert rating baseline date: 04/29/2021 Target Date Goal Was reviewed Status Code Progress towards goal/Likert rating  04/30/2023 04/28/2022          O 1/5 - pt continues to avoid social opportunities, makes little effort to  mobilize around goal to improve interpersonal interactions outside of immediate family  05/10/2024 05/11/2023          O 1/5 - pt made no progress, see need to focus on this area of his life this year        This plan has been reviewed and created by the following participants:  This plan will be reviewed at least every 12 months. Date Behavioral Health Clinician Date Guardian/Patient   04/28/2022  Hilma Favors, [name]   04/28/2022 Joel Mayer  05/11/2023 Hilma Favors, [name]  05/11/2023 Joel Mayer               Diagnosis:  Generalized Anxiety Disorder Major Depressive Disorder, recurrent, in partial remission   Joel Mayer reports that an old work friend reached out to him to join a dads walking group.  We d/e/p his resistance, anxiety and why he needs to do it.  I encouraged him to continue to f/t and pay attention to how he felt afterwards.  This led to a d/ about mindfulness, his habit of "being checked out."  the role of boredom in his life, the need to find purposeful activity and need to be intentional.    Joel Mayer states that he has tried to make a routine of checking in with his mom once a  week.  We d/ that this has helped the passive-aggressiveness recede.     Home Practice:  daily journaling on prompts d/ in therapy  Hilma Favors, PhD   ========================================  Wife: Herbert Seta Daughters:  [name] - Sophmore @ [employer] - 8th Grade @ [address]

## 2023-08-03 ENCOUNTER — Ambulatory Visit (INDEPENDENT_AMBULATORY_CARE_PROVIDER_SITE_OTHER): Payer: 59 | Admitting: Psychology

## 2023-08-03 DIAGNOSIS — F3341 Major depressive disorder, recurrent, in partial remission: Secondary | ICD-10-CM | POA: Diagnosis not present

## 2023-08-03 DIAGNOSIS — F411 Generalized anxiety disorder: Secondary | ICD-10-CM

## 2023-08-03 DIAGNOSIS — F320 Major depressive disorder, single episode, mild: Secondary | ICD-10-CM

## 2023-08-03 NOTE — Progress Notes (Signed)
PROGRESS NOTE:  Name: [name] Date: 08/03/2023 MRN: [id number] DOB: 1975-11-25 PCP: Creola Corn, MD  Time spent: 8:02 AM - 8:59 AM   Annual Review: 05/10/2024   Today I met with  Joel Mayer in remote video (Caregility) face-to-face individual psychotherapy.   Distance Site: Client's Home Orginating Site: Dr Odette Horns Remote Office Consent: Obtained verbal consent to transmit session remotely. Patient is aware of the inherent limitations in participating in virtual therapy.      Presenting Problem: Joel Mayer states that in the Fall and winter he experiences a pattern of depression consistent with SAD.   While he feels that things are better, he would like some assistance and moving forward.  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  He has been prescribed Prozac 60 mg 17 years.  He was in therapy with Dr. Christin Fudge until her retirement.  Dr. Christin Fudge referred Pt to this therapist.  Pt is married to Joel Mayer (44) married since September of '07.  This is a first marriage for both of them. They have two daughters together. Pt reports periodic conflicts with wife and need to be more involved with his daughters.  Mental Status Exam:  Appearance: Casual    Behavior: Appropriate Motor: Normal Speech/Language: Normal Rate Affect: Appropriate Mood: normal Thought process: normal Thought content: WNL Sensory/Perceptual disturbances: WNL Orientation: oriented to person Attention: Good Concentration: Good Memory: Immediate;   Good Fund of knowledge: Good Insight: Good Judgment: Good Impulse Control: Good    Individualized Treatment Plan                Strengths: Intelligent, resourceful, loyal, good problem solver, persistent, deals with adversity, friendly, humorous  Supports: spouse, friends (mostly out of town), mother   Goal/Needs for Treatment:  In order of importance to patient 1) Learn and implement mindfulness techniques for the purposes of relapse prevention. 2) Increased  skills, effectiveness, and confidence in parenting 3) For interpersonal deficits, help the client develop new interpersonal skills and relationships.  4) Develop healthy interpersonal relationships that lead to the alleviation and help prevent  the relapse of depression.   Client Statement of Needs: Requires assistance of being more aware and mindful in his day to day life experience especially as his mood and behaviors impact his relationships with his wife and children.  A focus on relapse prevention related to depression and maintenance, .   Treatment Level: Biweekly Individual Outpatient Psychotherapy  Symptoms:  History of chronic or recurrent depression for which the client takes antidepressant medication, diminished interest in or enjoyment of activities, hypersensitivity to the criticism or disapproval of others, Reluctant involvement in social situations out of fear of saying or doing something foolish or of becoming emotional in front of others, reports difficulty in managing the challenging problem behavior of their child, social withdrawal. Displays deficits in parenting knowledge and skills and lacks knowledge regarding reasonable expectations for a child's behavior at a given developmental level.  [name].  Client Treatment Preferences: continue treatment with current therapist   [name]:  Psychologist, Hilma Favors, [name] will support the patient's ability to achieve the goals identified. Cognitive Behavioral Therapy, Dialectical Behavioral Therapy, Motivational Interviewing, behavior activation, parent training, and other evidenced-based practices will be used to promote progress towards healthy functioning.   [name] "Joel Mayer" Trewin will: Actively participate in therapy, working towards healthy functioning.    *Justification for Continuation/Discontinuation of Goal: R=Revised, O=Ongoing, A=Achieved, D=Discontinued  Goal 1)  Learn and implement mindfulness techniques for  the purposes of relapse prevention.  5 Point Likert rating baseline date: 04/29/2021 Target Date Goal Was reviewed Status Code Progress towards goal/Likert rating  04/30/2023 04/28/2022          O 1/5 - pt has only a minimal level of the self awareness necessary to activate coping skills in a timely manner, unable to identify emotions  05/10/2024 05/11/2023          O 2/5 - pt demonstrates minimal progress, shows little self awareness necessary to activate coping skills in a timely manner, unable to identify emotions        Goal 2) Increased skills, effectiveness, and confidence in parenting  5 Point Likert rating baseline date: 04/29/2021 Target Date Goal Was reviewed Status Code Progress towards goal/Likert rating  04/30/2023 04/28/2022          O 2/5 - increased awareness of children's needs, increased level of attachment and engagement  05/10/2024 05/11/2023          O 4/5 - demonstrated the most progress in feeling more effective and engaged as a parent        Goal 3) For interpersonal deficits, help the client develop new interpersonal skills and relationships.   5 Point Likert rating baseline date: 04/29/2021 Target Date Goal Was reviewed Status Code Progress towards goal/Likert rating  04/30/2023 04/28/2022           O 1/5 - pt continues to avoid social opportunities, makes little effort to mobilize around goal to improve interpersonal interactions outside of immediate family  05/10/2024 05/11/2023           O 1/5 - pt made no progress, see need to focus on this area of his life this year        Goal 4) Develop healthy interpersonal relationships that lead to the alleviation and help prevent  the relapse of depression.    5 Point Likert rating baseline date: 04/29/2021 Target Date Goal Was reviewed Status Code Progress towards goal/Likert rating  04/30/2023 04/28/2022          O 1/5 - pt continues to avoid social opportunities, makes little effort to  mobilize around goal to improve interpersonal interactions outside of immediate family  05/10/2024 05/11/2023          O 1/5 - pt made no progress, see need to focus on this area of his life this year        This plan has been reviewed and created by the following participants:  This plan will be reviewed at least every 12 months. Date Behavioral Health Clinician Date Guardian/Patient   04/28/2022  Hilma Favors, [name]   04/28/2022 Zymeir "Joel Mayer" Boxell  05/11/2023 Hilma Favors, [name]  05/11/2023 Acxel "Joel Mayer" Kamen               Diagnosis:  Generalized Anxiety Disorder Major Depressive Disorder, recurrent, in partial remission   Joel Mayer reports that his daughters have been a handful lately.  We d/e/p what occurred, how he felt and how he responded.  This opened up a d/ about how out of touch he is with his own emotions and how he wasn't adept at tapping into empathy.  We made some connections between the past and the present.  I gave him a couple of exercises to practice the next two weeks.   Home Practice:  daily journaling on prompts d/ in therapy  Hilma Favors, PhD   ========================================  Wife: Herbert Seta Daughters:  [name] -  Soph[place] @ [employer] - 8th Grade @ [address]

## 2023-08-17 ENCOUNTER — Ambulatory Visit (INDEPENDENT_AMBULATORY_CARE_PROVIDER_SITE_OTHER): Payer: 59 | Admitting: Psychology

## 2023-08-17 DIAGNOSIS — F320 Major depressive disorder, single episode, mild: Secondary | ICD-10-CM

## 2023-08-17 DIAGNOSIS — F411 Generalized anxiety disorder: Secondary | ICD-10-CM | POA: Diagnosis not present

## 2023-08-17 DIAGNOSIS — F3341 Major depressive disorder, recurrent, in partial remission: Secondary | ICD-10-CM

## 2023-08-17 NOTE — Progress Notes (Signed)
PROGRESS NOTE:  Name: Joel Mayer Date: 08/17/2023 MRN: [id number] DOB: 1976-02-08 PCP: Creola Corn, MD  Time spent: 8:02 AM - 8:59 AM   Annual Review: 05/10/2024   Today I met with  Joel Mayer in remote video (Caregility) face-to-face individual psychotherapy.   Distance Site: Client's Home Orginating Site: Dr Odette Horns Remote Office Consent: Obtained verbal consent to transmit session remotely. Patient is aware of the inherent limitations in participating in virtual therapy.      Presenting Problem: Joel Mayer states that in the Fall and winter he experiences a pattern of depression consistent with SAD.   While he feels that things are better, he would like some assistance and moving forward.  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  He has been prescribed Prozac 60 mg 17 years.  He was in therapy with Dr. Christin Mayer until her retirement.  Dr. Christin Mayer referred Pt to this therapist.  Pt is married to Joel Mayer (44) married since September of '07.  This is a first marriage for both of them. They have two daughters together. Pt reports periodic conflicts with wife and need to be more involved with his daughters.  Mental Status Exam:  Appearance: Casual    Behavior: Appropriate Motor: Normal Speech/Language: Normal Rate Affect: Appropriate Mood: normal Thought process: normal Thought content: WNL Sensory/Perceptual disturbances: WNL Orientation: oriented to person Attention: Good Concentration: Good Memory: Immediate;   Good Fund of knowledge: Good Insight: Good Judgment: Good Impulse Control: Good    Individualized Treatment Plan                Strengths: Intelligent, resourceful, loyal, good problem solver, persistent, deals with adversity, friendly, humorous  Supports: spouse, friends (mostly out of town), mother   Goal/Needs for Treatment:  In order of importance to patient 1) Learn and implement mindfulness techniques for the purposes of relapse prevention. 2) Increased  skills, effectiveness, and confidence in parenting 3) For interpersonal deficits, help the client develop new interpersonal skills and relationships.  4) Develop healthy interpersonal relationships that lead to the alleviation and help prevent  the relapse of depression.   Client Statement of Needs: Requires assistance of being more aware and mindful in his day to day life experience especially as his mood and behaviors impact his relationships with his wife and children.  A focus on relapse prevention related to depression and maintenance, .   Treatment Level: Biweekly Individual Outpatient Psychotherapy  Symptoms:  History of chronic or recurrent depression for which the client takes antidepressant medication, diminished interest in or enjoyment of activities, hypersensitivity to the criticism or disapproval of others, Reluctant involvement in social situations out of fear of saying or doing something foolish or of becoming emotional in front of others, reports difficulty in managing the challenging problem behavior of their child, social withdrawal. Displays deficits in parenting knowledge and skills and lacks knowledge regarding reasonable expectations for a child's behavior at a given developmental level.  [name].  Client Treatment Preferences: continue treatment with current therapist   [name]:  Psychologist, Hilma Favors, [name] will support the patient's ability to achieve the goals identified. Cognitive Behavioral Therapy, Dialectical Behavioral Therapy, Motivational Interviewing, behavior activation, parent training, and other evidenced-based practices will be used to promote progress towards healthy functioning.   Healthcare consumer Desman "Joel Mayer" Riesgo will: Actively participate in therapy, working towards healthy functioning.    *Justification for Continuation/Discontinuation of Goal: R=Revised, O=Ongoing, A=Achieved, D=Discontinued  Goal 1)  Learn and implement mindfulness techniques for  the purposes of relapse prevention.  5 Point Likert rating baseline date: 04/29/2021 Target Date Goal Was reviewed Status Code Progress towards goal/Likert rating  04/30/2023 04/28/2022          O 1/5 - pt has only a minimal level of the self awareness necessary to activate coping skills in a timely manner, unable to identify emotions  05/10/2024 05/11/2023          O 2/5 - pt demonstrates minimal progress, shows little self awareness necessary to activate coping skills in a timely manner, unable to identify emotions        Goal 2) Increased skills, effectiveness, and confidence in parenting  5 Point Likert rating baseline date: 04/29/2021 Target Date Goal Was reviewed Status Code Progress towards goal/Likert rating  04/30/2023 04/28/2022          O 2/5 - increased awareness of children's needs, increased level of attachment and engagement  05/10/2024 05/11/2023          O 4/5 - demonstrated the most progress in feeling more effective and engaged as a parent        Goal 3) For interpersonal deficits, help the client develop new interpersonal skills and relationships.   5 Point Likert rating baseline date: 04/29/2021 Target Date Goal Was reviewed Status Code Progress towards goal/Likert rating  04/30/2023 04/28/2022           O 1/5 - pt continues to avoid social opportunities, makes little effort to mobilize around goal to improve interpersonal interactions outside of immediate family  05/10/2024 05/11/2023           O 1/5 - pt made no progress, see need to focus on this area of his life this year        Goal 4) Develop healthy interpersonal relationships that lead to the alleviation and help prevent  the relapse of depression.    5 Point Likert rating baseline date: 04/29/2021 Target Date Goal Was reviewed Status Code Progress towards goal/Likert rating  04/30/2023 04/28/2022          O 1/5 - pt continues to avoid social opportunities, makes little effort to  mobilize around goal to improve interpersonal interactions outside of immediate family  05/10/2024 05/11/2023          O 1/5 - pt made no progress, see need to focus on this area of his life this year        This plan has been reviewed and created by the following participants:  This plan will be reviewed at least every 12 months. Date Behavioral Health Clinician Date Guardian/Patient   04/28/2022  Hilma Favors, [name]   04/28/2022 Hermon "Joel Mayer" Allen  05/11/2023 Hilma Favors, [name]  05/11/2023 Tyheim "Joel Mayer" Hartig               Diagnosis:  Generalized Anxiety Disorder Major Depressive Disorder, recurrent, in partial remission   Joel Mayer reports that there have been some "strange" things happening at work that have been both inconvenient and anxiety provoking.  We d/e/p what's occurred and how to respond in a way that best manages his anxiety.  Joel Mayer states that his wife is concerned that he is socially reticent and cautions him to not "be like his father."  We d/e/p his social avoidance, need to find "motivation," the benefits of mindful presence and being intentional about his self talk.  I gave him some journaling prompts that he agreed to spend some time with daily.   Home  Practice:  daily journaling on prompts d/ in therapy  Hilma Favors, PhD   ========================================  Wife: Herbert Seta Daughters:  [name] - Sophmore @ [employer] [place] - 8th Grade @ [address]

## 2023-08-31 ENCOUNTER — Ambulatory Visit (INDEPENDENT_AMBULATORY_CARE_PROVIDER_SITE_OTHER): Payer: 59 | Admitting: Psychology

## 2023-08-31 DIAGNOSIS — F411 Generalized anxiety disorder: Secondary | ICD-10-CM

## 2023-08-31 DIAGNOSIS — F3341 Major depressive disorder, recurrent, in partial remission: Secondary | ICD-10-CM | POA: Diagnosis not present

## 2023-08-31 DIAGNOSIS — F320 Major depressive disorder, single episode, mild: Secondary | ICD-10-CM

## 2023-08-31 NOTE — Progress Notes (Signed)
PROGRESS NOTE:  Name: [name] Date: 08/31/2023 MRN: [id number] DOB: 14-Dec-1975 PCP: Creola Corn, MD  Time spent: 8:01 AM - 8:59 AM   Annual Review: 05/10/2024   Today I met with  [name] in remote video (Caregility) face-to-face individual psychotherapy.   Distance Site: Client's Home Orginating Site: Dr Odette Horns Remote Office Consent: Obtained verbal consent to transmit session remotely. Patient is aware of the inherent limitations in participating in virtual therapy.      Presenting Problem: Joel Mayer states that in the Fall and winter he experiences a pattern of depression consistent with SAD.   While he feels that things are better, he would like some assistance and moving forward.  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  He has been prescribed Prozac 60 mg 17 years.  He was in therapy with Dr. Christin Fudge until her retirement.  Dr. Christin Fudge referred Pt to this therapist.  Pt is married to [name] (44) married since September of '07.  This is a first marriage for both of them. They have two daughters together. Pt reports periodic conflicts with wife and need to be more involved with his daughters.  Mental Status Exam:  Appearance: Casual    Behavior: Appropriate Motor: Normal Speech/Language: Normal Rate Affect: Appropriate Mood: normal Thought process: normal Thought content: WNL Sensory/Perceptual disturbances: WNL Orientation: oriented to person Attention: Good Concentration: Good Memory: Immediate;   Good Fund of knowledge: Good Insight: Good Judgment: Good Impulse Control: Good    Individualized Treatment Plan                Strengths: Intelligent, resourceful, loyal, good problem solver, persistent, deals with adversity, friendly, humorous  Supports: spouse, friends (mostly out of town), mother   Goal/Needs for Treatment:  In order of importance to patient 1) Learn and implement mindfulness techniques for the purposes of relapse prevention. 2) Increased  skills, effectiveness, and confidence in parenting 3) For interpersonal deficits, help the client develop new interpersonal skills and relationships.  4) Develop healthy interpersonal relationships that lead to the alleviation and help prevent  the relapse of depression.   Client Statement of Needs: Requires assistance of being more aware and mindful in his day to day life experience especially as his mood and behaviors impact his relationships with his wife and children.  A focus on relapse prevention related to depression and maintenance, .   Treatment Level: Biweekly Individual Outpatient Psychotherapy  Symptoms:  History of chronic or recurrent depression for which the client takes antidepressant medication, diminished interest in or enjoyment of activities, hypersensitivity to the criticism or disapproval of others, Reluctant involvement in social situations out of fear of saying or doing something foolish or of becoming emotional in front of others, reports difficulty in managing the challenging problem behavior of their child, social withdrawal. Displays deficits in parenting knowledge and skills and lacks knowledge regarding reasonable expectations for a child's behavior at a given developmental level.  [name].  Client Treatment Preferences: continue treatment with current therapist   [name]:  Psychologist, Hilma Favors, [name] will support the patient's ability to achieve the goals identified. Cognitive Behavioral Therapy, Dialectical Behavioral Therapy, Motivational Interviewing, behavior activation, parent training, and other evidenced-based practices will be used to promote progress towards healthy functioning.   Healthcare consumer Joel "Joel Mayer" Mayer will: Actively participate in therapy, working towards healthy functioning.    *Justification for Continuation/Discontinuation of Goal: R=Revised, O=Ongoing, A=Achieved, D=Discontinued  Goal 1)  Learn and implement mindfulness techniques for  the purposes of relapse prevention.  5 Point Likert rating baseline date: 04/29/2021 Target Date Goal Was reviewed Status Code Progress towards goal/Likert rating  04/30/2023 04/28/2022          O 1/5 - pt has only a minimal level of the self awareness necessary to activate coping skills in a timely manner, unable to identify emotions  05/10/2024 05/11/2023          O 2/5 - pt demonstrates minimal progress, shows little self awareness necessary to activate coping skills in a timely manner, unable to identify emotions        Goal 2) Increased skills, effectiveness, and confidence in parenting  5 Point Likert rating baseline date: 04/29/2021 Target Date Goal Was reviewed Status Code Progress towards goal/Likert rating  04/30/2023 04/28/2022          O 2/5 - increased awareness of children's needs, increased level of attachment and engagement  05/10/2024 05/11/2023          O 4/5 - demonstrated the most progress in feeling more effective and engaged as a parent        Goal 3) For interpersonal deficits, help the client develop new interpersonal skills and relationships.   5 Point Likert rating baseline date: 04/29/2021 Target Date Goal Was reviewed Status Code Progress towards goal/Likert rating  04/30/2023 04/28/2022           O 1/5 - pt continues to avoid social opportunities, makes little effort to mobilize around goal to improve interpersonal interactions outside of immediate family  05/10/2024 05/11/2023           O 1/5 - pt made no progress, see need to focus on this area of his life this year        Goal 4) Develop healthy interpersonal relationships that lead to the alleviation and help prevent  the relapse of depression.    5 Point Likert rating baseline date: 04/29/2021 Target Date Goal Was reviewed Status Code Progress towards goal/Likert rating  04/30/2023 04/28/2022          O 1/5 - pt continues to avoid social opportunities, makes little effort to  mobilize around goal to improve interpersonal interactions outside of immediate family  05/10/2024 05/11/2023          O 1/5 - pt made no progress, see need to focus on this area of his life this year        This plan has been reviewed and created by the following participants:  This plan will be reviewed at least every 12 months. Date Behavioral Health Clinician Date Guardian/Patient   04/28/2022  Hilma Favors, [name]   04/28/2022 Kyrian "Joel Mayer" Dadisman  05/11/2023 Hilma Favors, [name]  05/11/2023 Glenford "Joel Mayer" Stillion               Diagnosis:  Generalized Anxiety Disorder Major Depressive Disorder, recurrent, in partial remission   Joel Mayer reports that he spent some time journaling on prompts that we d/ last session.  We d/e/p what he wrote, what he noticed and didn't notice.  It became clear that Joel Mayer has very little awareness of his feelings, or awareness of how he is impacted by his immediate experience and feels "numb" to his existence most of the time.  We d/e/p these patterns and made connections between the past and the present.  I provided support and the guidance necessary for him to begin developing a more mindful stance to his environment.  He was instructed to  journal and some specific prompts.   Home Practice:  daily journaling on prompts d/ in therapy  Hilma Favors, PhD   ========================================  Wife: Herbert Seta Daughters:  [name] - Sophmore @ [employer] [place] - 8th Grade @ [address]

## 2023-09-14 ENCOUNTER — Ambulatory Visit (INDEPENDENT_AMBULATORY_CARE_PROVIDER_SITE_OTHER): Payer: 59 | Admitting: Psychology

## 2023-09-14 DIAGNOSIS — F3341 Major depressive disorder, recurrent, in partial remission: Secondary | ICD-10-CM

## 2023-09-14 DIAGNOSIS — F4010 Social phobia, unspecified: Secondary | ICD-10-CM

## 2023-09-14 DIAGNOSIS — F411 Generalized anxiety disorder: Secondary | ICD-10-CM | POA: Diagnosis not present

## 2023-09-14 NOTE — Progress Notes (Signed)
PROGRESS NOTE:  Name: Joel Mayer Date: 09/14/2023 MRN: [id number] DOB: [birth year] PCP: Creola Corn, MD  Time spent: 8:00 AM - 8:58 AM   Annual Review: 05/10/2024   Today I met with  Joel Mayer in remote video (Caregility) face-to-face individual psychotherapy.   Distance Site: Client's Home Orginating Site: Dr Odette Horns Remote Office Consent: Obtained verbal consent to transmit session remotely. Patient is aware of the inherent limitations in participating in virtual therapy.      Presenting Problem: Joel Mayer states that in the Fall and winter he experiences a pattern of depression consistent with SAD.  While he feels that things are better, he would like some assistance and moving forward.  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  He has been prescribed Prozac 60 mg 17 years.  He was in therapy with Dr. Christin Fudge until her retirement.  Dr. Christin Fudge referred Pt to this therapist.  Pt is married to Joel Mayer (44) married since September of '07.  This is a first marriage for both of them. They have two daughters together. Pt reports periodic conflicts with wife and need to be more involved with his daughters.  Mental Status Exam:  Appearance: Casual    Behavior: Appropriate Motor: Normal Speech/Language: Normal Rate Affect: Appropriate Mood: normal Thought process: normal Thought content: WNL Sensory/Perceptual disturbances: WNL Orientation: oriented to person Attention: Good Concentration: Good Memory: Immediate;   Good Fund of knowledge: Good Insight: Good Judgment: Good Impulse Control: Good    Individualized Treatment Plan                Strengths: Intelligent, resourceful, loyal, good problem solver, persistent, deals with adversity, friendly, humorous  Supports: spouse, friends (mostly out of town), mother   Goal/Needs for Treatment:  In order of importance to patient 1) Learn and implement mindfulness techniques for the purposes of relapse prevention. 2) Increased  skills, effectiveness, and confidence in parenting 3) For interpersonal deficits, help the client develop new interpersonal skills and relationships.  4) Develop healthy interpersonal relationships that lead to the alleviation and help prevent  the relapse of depression.   Client Statement of Needs: Requires assistance of being more aware and mindful in his day to day life experience especially as his mood and behaviors impact his relationships with his wife and children.  A focus on relapse prevention related to depression and maintenance, .   Treatment Level: Biweekly Individual Outpatient Psychotherapy  Symptoms:  History of chronic or recurrent depression for which the client takes antidepressant medication, diminished interest in or enjoyment of activities, hypersensitivity to the criticism or disapproval of others, Reluctant involvement in social situations out of fear of saying or doing something foolish or of becoming emotional in front of others, reports difficulty in managing the challenging problem behavior of their child, social withdrawal. Displays deficits in parenting knowledge and skills and lacks knowledge regarding reasonable expectations for a child's behavior at a given developmental level.  [name].  Client Treatment Preferences: continue treatment with current therapist   [name]:  Psychologist, Hilma Favors, [name] will support the patient's ability to achieve the goals identified. Cognitive Behavioral Therapy, Dialectical Behavioral Therapy, Motivational Interviewing, behavior activation, parent training, and other evidenced-based practices will be used to promote progress towards healthy functioning.   [name] will: Actively participate in therapy, working towards healthy functioning.    *Justification for Continuation/Discontinuation of Goal: R=Revised, O=Ongoing, A=Achieved, D=Discontinued  Goal 1)  Learn and implement mindfulness techniques for the  purposes of relapse prevention.  5 Point Likert rating baseline date: 04/29/2021 Target Date Goal Was reviewed Status Code Progress towards goal/Likert rating  04/30/2023 04/28/2022          O 1/5 - pt has only a minimal level of the self awareness necessary to activate coping skills in a timely manner, unable to identify emotions  05/10/2024 05/11/2023          O 2/5 - pt demonstrates minimal progress, shows little self awareness necessary to activate coping skills in a timely manner, unable to identify emotions        Goal 2) Increased skills, effectiveness, and confidence in parenting  5 Point Likert rating baseline date: 04/29/2021 Target Date Goal Was reviewed Status Code Progress towards goal/Likert rating  04/30/2023 04/28/2022          O 2/5 - increased awareness of children's needs, increased level of attachment and engagement  05/10/2024 05/11/2023          O 4/5 - demonstrated the most progress in feeling more effective and engaged as a parent        Goal 3) For interpersonal deficits, help the client develop new interpersonal skills and relationships.   5 Point Likert rating baseline date: 04/29/2021 Target Date Goal Was reviewed Status Code Progress towards goal/Likert rating  04/30/2023 04/28/2022           O 1/5 - pt continues to avoid social opportunities, makes little effort to mobilize around goal to improve interpersonal interactions outside of immediate family  05/10/2024 05/11/2023           O 1/5 - pt made no progress, see need to focus on this area of his life this year        Goal 4) Develop healthy interpersonal relationships that lead to the alleviation and help prevent  the relapse of depression.    5 Point Likert rating baseline date: 04/29/2021 Target Date Goal Was reviewed Status Code Progress towards goal/Likert rating  04/30/2023 04/28/2022          O 1/5 - pt continues to avoid social opportunities, makes little effort to  mobilize around goal to improve interpersonal interactions outside of immediate family  05/10/2024 05/11/2023          O 1/5 - pt made no progress, see need to focus on this area of his life this year        This plan has been reviewed and created by the following participants:  This plan will be reviewed at least every 12 months. Date Behavioral Health Clinician Date Guardian/Patient   04/28/2022  Hilma Favors, [name]   04/28/2022 Taishawn "Joel Mayer" Kaczmarek  05/11/2023 Hilma Favors, [name]  05/11/2023 Antwane "Joel Mayer" Wemhoff               Diagnosis:  Generalized Anxiety Disorder Major Depressive Disorder, recurrent, in partial remission   Joel Mayer reports that he attended a school function at his daughter's school and found himself unable to interact with the other parents.  Instead, he left early while his wife stayed.  We d/e/p his social anxiety, his internal anxious dialogue, the numerous problematic judgments he makes about himself and others.  I noted the needed for positive self talk, managing his judgments and the need for a different mindset as he approaches these situations.   I provided additional psych eduction around mindfulness, managing judgments, acceptance and finding equanimity.   Joel Mayer was able to see the benefit of these  practices, the need for mindfulness throughout the day and was able to connect to times when he was mindful and less anxious.  Lastly, we talked about my vacation schedule, how I might be reached in the case of an emergency, and when we would meet next.   Home Practice: daily journaling on prompts d/ in therapy  Hilma Favors, PhD   ========================================  Wife: Herbert Seta Daughters:  [name] - Sophmore @ [employer] [place] - 8th Grade @ [address]

## 2023-10-12 ENCOUNTER — Ambulatory Visit (INDEPENDENT_AMBULATORY_CARE_PROVIDER_SITE_OTHER): Payer: 59 | Admitting: Psychology

## 2023-10-12 DIAGNOSIS — F3341 Major depressive disorder, recurrent, in partial remission: Secondary | ICD-10-CM

## 2023-10-12 DIAGNOSIS — F411 Generalized anxiety disorder: Secondary | ICD-10-CM

## 2023-10-12 NOTE — Progress Notes (Signed)
PROGRESS NOTE:  Name: Rumeal Underkoffler Date: 10/12/2023 MRN: [id number] DOB: February 11, 1976 PCP: Creola Corn, MD  Time spent: 8:00 AM - 8:57 AM   Annual Review: 05/10/2024   Today I met with  [name] in remote video (Caregility) face-to-face individual psychotherapy.   Distance Site: Client's Home Orginating Site: Dr Odette Horns Remote Office Consent: Obtained verbal consent to transmit session remotely. Patient is aware of the inherent limitations in participating in virtual therapy.      Presenting Problem: [name] states that in the Fall and winter he experiences a pattern of depression consistent with SAD.  While he feels that things are better, he would like some assistance and moving forward.  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  He has been prescribed Prozac 60 mg 17 years.  He was in therapy with Dr. Christin Fudge until her retirement.  Dr. Christin Fudge referred Pt to this therapist.  Pt is married to [place] (45) married since September of '07.  This is a first marriage for both of them. They have two daughters together. Pt reports periodic conflicts with wife and need to be more involved with his daughters.  Mental Status Exam:  Appearance: Casual    Behavior: Appropriate Motor: Normal Speech/Language: Normal Rate Affect: Appropriate Mood: normal Thought process: normal Thought content: WNL Sensory/Perceptual disturbances: WNL Orientation: oriented to person Attention: Good Concentration: Good Memory: Immediate;   Good Fund of knowledge: Good Insight: Good Judgment: Good Impulse Control: Good    Individualized Treatment Plan                Strengths: Intelligent, resourceful, loyal, good problem solver, persistent, deals with adversity, friendly, humorous  Supports: spouse, friends (mostly out of town), mother   Goal/Needs for Treatment:  In order of importance to patient 1) Learn and implement mindfulness techniques for the purposes of relapse prevention. 2) Increased  skills, effectiveness, and confidence in parenting 3) For interpersonal deficits, help the client develop new interpersonal skills and relationships.  4) Develop healthy interpersonal relationships that lead to the alleviation and help prevent  the relapse of depression.   Client Statement of Needs: Requires assistance of being more aware and mindful in his day to day life experience especially as his mood and behaviors impact his relationships with his wife and children.  A focus on relapse prevention related to depression and maintenance, .   Treatment Level: Biweekly Individual Outpatient Psychotherapy  Symptoms:  History of chronic or recurrent depression for which the client takes antidepressant medication, diminished interest in or enjoyment of activities, hypersensitivity to the criticism or disapproval of others, Reluctant involvement in social situations out of fear of saying or doing something foolish or of becoming emotional in front of others, reports difficulty in managing the challenging problem behavior of their child, social withdrawal. Displays deficits in parenting knowledge and skills and lacks knowledge regarding reasonable expectations for a child's behavior at a given developmental level.  [name].  Client Treatment Preferences: continue treatment with current therapist   [name]:  Psychologist, Hilma Favors, [name] will support the patient's ability to achieve the goals identified. Cognitive Behavioral Therapy, Dialectical Behavioral Therapy, Motivational Interviewing, behavior activation, parent training, and other evidenced-based practices will be used to promote progress towards healthy functioning.   Healthcare consumer Jere "[name]" Regan will: Actively participate in therapy, working towards healthy functioning.    *Justification for Continuation/Discontinuation of Goal: R=Revised, O=Ongoing, A=Achieved, D=Discontinued  Goal 1)  Learn and implement mindfulness techniques for the  purposes of relapse prevention.  5 Point Likert rating baseline date: 04/29/2021 Target Date Goal Was reviewed Status Code Progress towards goal/Likert rating  04/30/2023 04/28/2022          O 1/5 - pt has only a minimal level of the self awareness necessary to activate coping skills in a timely manner, unable to identify emotions  05/10/2024 05/11/2023          O 2/5 - pt demonstrates minimal progress, shows little self awareness necessary to activate coping skills in a timely manner, unable to identify emotions        Goal 2) Increased skills, effectiveness, and confidence in parenting  5 Point Likert rating baseline date: 04/29/2021 Target Date Goal Was reviewed Status Code Progress towards goal/Likert rating  04/30/2023 04/28/2022          O 2/5 - increased awareness of children's needs, increased level of attachment and engagement  05/10/2024 05/11/2023          O 4/5 - demonstrated the most progress in feeling more effective and engaged as a parent        Goal 3) For interpersonal deficits, help the client develop new interpersonal skills and relationships.   5 Point Likert rating baseline date: 04/29/2021 Target Date Goal Was reviewed Status Code Progress towards goal/Likert rating  04/30/2023 04/28/2022           O 1/5 - pt continues to avoid social opportunities, makes little effort to mobilize around goal to improve interpersonal interactions outside of immediate family  05/10/2024 05/11/2023           O 1/5 - pt made no progress, see need to focus on this area of his life this year        Goal 4) Develop healthy interpersonal relationships that lead to the alleviation and help prevent  the relapse of depression.    5 Point Likert rating baseline date: 04/29/2021 Target Date Goal Was reviewed Status Code Progress towards goal/Likert rating  04/30/2023 04/28/2022          O 1/5 - pt continues to avoid social opportunities, makes little effort to  mobilize around goal to improve interpersonal interactions outside of immediate family  05/10/2024 05/11/2023          O 1/5 - pt made no progress, see need to focus on this area of his life this year        This plan has been reviewed and created by the following participants:  This plan will be reviewed at least every 12 months. Date Behavioral Health Clinician Date Guardian/Patient   04/28/2022  Hilma Favors, [name]   04/28/2022 Long "[name]" Mattson  05/11/2023 Hilma Favors, [name]  05/11/2023 Jesselee "[name]" Stangler               Diagnosis:  Generalized Anxiety Disorder Major Depressive Disorder, recurrent, in partial remission   [name] reports that he has been sick for the past two weeks.  He states that he was very upset by the results of the election.  We d/p how he felt, his thoughts and how he is coping.  We emphasized the need to focus on just living his day-to-day life and his family.    [name] and I d/e/p what it was like having his mom around for the holiday, learning to not personalize her odd social interactions and enjoying himself despite her presence.  We reflected on their relationship, his childhood, his parents' childhoods and made connections  between the past and the present.   Home Practice: daily journaling on prompts d/ in therapy  Hilma Favors, PhD   ========================================  Wife: Herbert Seta Daughters:  [name] - Sophmore @ [employer] - 8th Grade @ [address]

## 2023-10-26 ENCOUNTER — Ambulatory Visit: Payer: 59 | Admitting: Psychology

## 2023-10-26 DIAGNOSIS — F411 Generalized anxiety disorder: Secondary | ICD-10-CM

## 2023-10-26 DIAGNOSIS — F320 Major depressive disorder, single episode, mild: Secondary | ICD-10-CM

## 2023-10-26 DIAGNOSIS — F3341 Major depressive disorder, recurrent, in partial remission: Secondary | ICD-10-CM | POA: Diagnosis not present

## 2023-10-26 NOTE — Progress Notes (Signed)
PROGRESS NOTE:  Name: Joel Mayer Date: 10/26/2023 MRN: [id number] DOB: [birth year] PCP: Creola Corn, MD  Time spent: 8:00 AM - 8:58 AM   Annual Review: 05/10/2024   Today I met with  Joel Mayer in remote video (Caregility) face-to-face individual psychotherapy.   Distance Site: Client's Home Orginating Site: Dr Odette Horns Remote Office Consent: Obtained verbal consent to transmit session remotely. Patient is aware of the inherent limitations in participating in virtual therapy.      Presenting Problem: Joel Mayer states that in the Fall and winter he experiences a pattern of depression consistent with SAD.  While he feels that things are better, he would like some assistance and moving forward.  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  He has been prescribed Prozac 60 mg 17 years.  He was in therapy with Dr. Christin Fudge until her retirement.  Dr. Christin Fudge referred Pt to this therapist.  Pt is married to [place] (45) married since September of '07.  This is a first marriage for both of them. They have two daughters together. Pt reports periodic conflicts with wife and need to be more involved with his daughters.  Mental Status Exam:  Appearance: Casual    Behavior: Appropriate Motor: Normal Speech/Language: Normal Rate Affect: Appropriate Mood: normal Thought process: normal Thought content: WNL Sensory/Perceptual disturbances: WNL Orientation: oriented to person Attention: Good Concentration: Good Memory: Immediate;   Good Fund of knowledge: Good Insight: Good Judgment: Good Impulse Control: Good    Individualized Treatment Plan                Strengths: Intelligent, resourceful, loyal, good problem solver, persistent, deals with adversity, friendly, humorous  Supports: spouse, friends (mostly out of town), mother   Goal/Needs for Treatment:  In order of importance to patient 1) Learn and implement mindfulness techniques for the purposes of relapse prevention. 2) Increased  skills, effectiveness, and confidence in parenting 3) For interpersonal deficits, help the client develop new interpersonal skills and relationships.  4) Develop healthy interpersonal relationships that lead to the alleviation and help prevent  the relapse of depression.   Client Statement of Needs: Requires assistance of being more aware and mindful in his day to day life experience especially as his mood and behaviors impact his relationships with his wife and children.  A focus on relapse prevention related to depression and maintenance, .   Treatment Level: Biweekly Individual Outpatient Psychotherapy  Symptoms:  History of chronic or recurrent depression for which the client takes antidepressant medication, diminished interest in or enjoyment of activities, hypersensitivity to the criticism or disapproval of others, Reluctant involvement in social situations out of fear of saying or doing something foolish or of becoming emotional in front of others, reports difficulty in managing the challenging problem behavior of their child, social withdrawal. Displays deficits in parenting knowledge and skills and lacks knowledge regarding reasonable expectations for a child's behavior at a given developmental level.  [name].  Client Treatment Preferences: continue treatment with current therapist   [name]:  Psychologist, Hilma Favors, [name] will support the patient's ability to achieve the goals identified. Cognitive Behavioral Therapy, Dialectical Behavioral Therapy, Motivational Interviewing, behavior activation, parent training, and other evidenced-based practices will be used to promote progress towards healthy functioning.   [name] will: Actively participate in therapy, working towards healthy functioning.    *Justification for Continuation/Discontinuation of Goal: R=Revised, O=Ongoing, A=Achieved, D=Discontinued  Goal 1)  Learn and implement mindfulness techniques for  the purposes of relapse prevention.  5 Point Likert rating baseline date: 04/29/2021 Target Date Goal Was reviewed Status Code Progress towards goal/Likert rating  04/30/2023 04/28/2022          O 1/5 - pt has only a minimal level of the self awareness necessary to activate coping skills in a timely manner, unable to identify emotions  05/10/2024 05/11/2023          O 2/5 - pt demonstrates minimal progress, shows little self awareness necessary to activate coping skills in a timely manner, unable to identify emotions        Goal 2) Increased skills, effectiveness, and confidence in parenting  5 Point Likert rating baseline date: 04/29/2021 Target Date Goal Was reviewed Status Code Progress towards goal/Likert rating  04/30/2023 04/28/2022          O 2/5 - increased awareness of children's needs, increased level of attachment and engagement  05/10/2024 05/11/2023          O 4/5 - demonstrated the most progress in feeling more effective and engaged as a parent        Goal 3) For interpersonal deficits, help the client develop new interpersonal skills and relationships.   5 Point Likert rating baseline date: 04/29/2021 Target Date Goal Was reviewed Status Code Progress towards goal/Likert rating  04/30/2023 04/28/2022           O 1/5 - pt continues to avoid social opportunities, makes little effort to mobilize around goal to improve interpersonal interactions outside of immediate family  05/10/2024 05/11/2023           O 1/5 - pt made no progress, see need to focus on this area of his life this year        Goal 4) Develop healthy interpersonal relationships that lead to the alleviation and help prevent  the relapse of depression.    5 Point Likert rating baseline date: 04/29/2021 Target Date Goal Was reviewed Status Code Progress towards goal/Likert rating  04/30/2023 04/28/2022          O 1/5 - pt continues to avoid social opportunities, makes little effort to  mobilize around goal to improve interpersonal interactions outside of immediate family  05/10/2024 05/11/2023          O 1/5 - pt made no progress, see need to focus on this area of his life this year        This plan has been reviewed and created by the following participants:  This plan will be reviewed at least every 12 months. Date Behavioral Health Clinician Date Guardian/Patient   04/28/2022  Hilma Favors, [name]   04/28/2022 Joel Mayer  05/11/2023 Hilma Favors, [name]  05/11/2023 Joel Mayer               Diagnosis:  Generalized Anxiety Disorder Major Depressive Disorder, recurrent, in partial remission   Joel Mayer reports that he isn't looking forward to the holidays because of his mother.  We d/e/p his frustrations with his mother, re-framing her behavior, learning not to personalize her behavior, and creating a plan for Christmas.  We ended with a d/ of what he is looking forward to in order to end on a positive note.  We reviewed my upcoming holiday break and confirmed our next appointment.  Home Practice: daily journaling on prompts d/ in therapy  Hilma Favors, PhD   ========================================  Wife: Herbert Seta Daughters:  [name] - [employer] @ [employer]  Jae Dire - 8th Grade @ [address]

## 2023-11-09 ENCOUNTER — Ambulatory Visit: Payer: 59 | Admitting: Psychology

## 2023-11-23 ENCOUNTER — Ambulatory Visit: Payer: 59 | Admitting: Psychology

## 2023-11-23 DIAGNOSIS — F3341 Major depressive disorder, recurrent, in partial remission: Secondary | ICD-10-CM

## 2023-11-23 DIAGNOSIS — F411 Generalized anxiety disorder: Secondary | ICD-10-CM

## 2023-11-23 NOTE — Progress Notes (Signed)
 PROGRESS NOTE:  Name: Joel Mayer Date: 11/23/2023 MRN: [id number] DOB: 1976/08/25 PCP: Onita Norleen, MD  Time spent: 8:00 AM - 8:59 AM   Annual Review: 05/10/2024   Today I met with  [name] in remote video (Caregility) face-to-face individual psychotherapy.   Distance Site: Client's Home Orginating Site: Dr Edison Remote Office Consent: Obtained verbal consent to transmit session remotely. Patient is aware of the inherent limitations in participating in virtual therapy.      Presenting Problem: Joel Mayer states that in the Fall and winter he experiences a pattern of depression consistent with SAD.  While he feels that things are better, he would like some assistance and moving forward.  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  He has been prescribed Prozac 60 mg 17 years.  He was in therapy with Dr. Arzella until her retirement.  Dr. Arzella referred Pt to this therapist.  Pt is married to Joel Mayer (44) married since September of '07.  This is a first marriage for both of them. They have two daughters together. Pt reports periodic conflicts with wife and need to be more involved with his daughters.  Mental Status Exam:  Appearance: Casual    Behavior: Appropriate Motor: Normal Speech/Language: Normal Rate Affect: Appropriate Mood: normal Thought process: normal Thought content: WNL Sensory/Perceptual disturbances: WNL Orientation: oriented to person Attention: Good Concentration: Good Memory: Immediate;   Good Fund of knowledge: Good Insight: Good Judgment: Good Impulse Control: Good    Individualized Treatment Plan                Strengths: Intelligent, resourceful, loyal, good problem solver, persistent, deals with adversity, friendly, humorous  Supports: spouse, friends (mostly out of town), mother   Goal/Needs for Treatment:  In order of importance to patient 1) Learn and implement mindfulness techniques for the purposes of relapse prevention. 2) Increased  skills, effectiveness, and confidence in parenting 3) For interpersonal deficits, help the client develop new interpersonal skills and relationships.  4) Develop healthy interpersonal relationships that lead to the alleviation and help prevent  the relapse of depression.   Client Statement of Needs: Requires assistance of being more aware and mindful in his day to day life experience especially as his mood and behaviors impact his relationships with his wife and children.  A focus on relapse prevention related to depression and maintenance, .   Treatment Level: Biweekly Individual Outpatient Psychotherapy  Symptoms:  History of chronic or recurrent depression for which the client takes antidepressant medication, diminished interest in or enjoyment of activities, hypersensitivity to the criticism or disapproval of others, Reluctant involvement in social situations out of fear of saying or doing something foolish or of becoming emotional in front of others, reports difficulty in managing the challenging problem behavior of their child, social withdrawal. Displays deficits in parenting knowledge and skills and lacks knowledge regarding reasonable expectations for a child's behavior at a given developmental level.  [name].  Client Treatment Preferences: continue treatment with current therapist   [name]:  Psychologist, [name], [name] will support the patient's ability to achieve the goals identified. Cognitive Behavioral Therapy, Dialectical Behavioral Therapy, Motivational Interviewing, behavior activation, parent training, and other evidenced-based practices will be used to promote progress towards healthy functioning.   Healthcare consumer Cohan Stipes will: Actively participate in therapy, working towards healthy functioning.    *Justification for Continuation/Discontinuation of Goal: R=Revised, O=Ongoing, A=Achieved, D=Discontinued  Goal 1)  Learn and implement mindfulness techniques for  the purposes of relapse prevention.  5 Point Likert rating baseline date: 04/29/2021 Target Date Goal Was reviewed Status Code Progress towards goal/Likert rating  04/30/2023 04/28/2022          O 1/5 - pt has only a minimal level of the self awareness necessary to activate coping skills in a timely manner, unable to identify emotions  05/10/2024 05/11/2023          O 2/5 - pt demonstrates minimal progress, shows little self awareness necessary to activate coping skills in a timely manner, unable to identify emotions        Goal 2) Increased skills, effectiveness, and confidence in parenting  5 Point Likert rating baseline date: 04/29/2021 Target Date Goal Was reviewed Status Code Progress towards goal/Likert rating  04/30/2023 04/28/2022          O 2/5 - increased awareness of children's needs, increased level of attachment and engagement  05/10/2024 05/11/2023          O 4/5 - demonstrated the most progress in feeling more effective and engaged as a parent        Goal 3) For interpersonal deficits, help the client develop new interpersonal skills and relationships.   5 Point Likert rating baseline date: 04/29/2021 Target Date Goal Was reviewed Status Code Progress towards goal/Likert rating  04/30/2023 04/28/2022           O 1/5 - pt continues to avoid social opportunities, makes little effort to mobilize around goal to improve interpersonal interactions outside of immediate family  05/10/2024 05/11/2023           O 1/5 - pt made no progress, see need to focus on this area of his life this year        Goal 4) Develop healthy interpersonal relationships that lead to the alleviation and help prevent  the relapse of depression.    5 Point Likert rating baseline date: 04/29/2021 Target Date Goal Was reviewed Status Code Progress towards goal/Likert rating  04/30/2023 04/28/2022          O 1/5 - pt continues to avoid social opportunities, makes little effort to  mobilize around goal to improve interpersonal interactions outside of immediate family  05/10/2024 05/11/2023          O 1/5 - pt made no progress, see need to focus on this area of his life this year        This plan has been reviewed and created by the following participants:  This plan will be reviewed at least every 12 months. Date Behavioral Health Clinician Date Guardian/Patient   04/28/2022  [name], [name]   04/28/2022 Norleen Banks [place]  05/11/2023 [name], [name]  05/11/2023 Norleen Banks [place]               Diagnosis:  Generalized Anxiety Disorder Major Depressive Disorder, recurrent, in partial remission   During our last session, Banks reported that he wasn't looking forward to the holidays because of his mother.  We d/e/p his attempt to offer a more comfortable arrangement, how he attempted to manage her behavior, when she refused, and arriving at a better understanding of her anxieties possibly related to her memory.  We also talked about the absence of his father, and continued to process his grief.  Banks states that he attempted to be more social around the holiday break.  We d/e/p his social anxiety, how he used self talk to manage his anticipatory anxiety, and  feeling good about the effort.  Lastly, we outlined a few goals for Joel Mayer to focus on in 2025.      Home Practice: daily journaling on prompts d/ in therapy  [name], PhD   ========================================  Wife: [place] Daughters:  [name] - Sophmore @ [employer] [place] - 8th Grade @ [address]

## 2023-12-07 ENCOUNTER — Ambulatory Visit: Payer: 59 | Admitting: Psychology

## 2023-12-07 DIAGNOSIS — F411 Generalized anxiety disorder: Secondary | ICD-10-CM

## 2023-12-07 DIAGNOSIS — F3341 Major depressive disorder, recurrent, in partial remission: Secondary | ICD-10-CM | POA: Diagnosis not present

## 2023-12-07 NOTE — Progress Notes (Signed)
PROGRESS NOTE:  Name: Joel Mayer Date: 12/07/2023 MRN: [id number] DOB: [birth year] PCP: Creola Corn, MD  Time spent: 8:00 AM - 8:58 AM   Annual Review: 05/10/2024   Today I met with  Joel Mayer in remote video (Caregility) face-to-face individual psychotherapy.   Distance Site: Client's Home Orginating Site: Dr Odette Horns Remote Office Consent: Obtained verbal consent to transmit session remotely. Patient is aware of the inherent limitations in participating in virtual therapy.      Presenting Problem: Joel Mayer states that in the Fall and winter he experiences a pattern of depression consistent with SAD.  While he feels that things are better, he would like some assistance and moving forward.  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  He has been prescribed Prozac 60 mg 17 years.  He was in therapy with Dr. Christin Fudge until her retirement.  Dr. Christin Fudge referred Pt to this therapist.  Pt is married to Joel Mayer (44) married since September of '07.  This is a first marriage for both of them. They have two daughters together. Pt reports periodic conflicts with wife and need to be more involved with his daughters.  Mental Status Exam:  Appearance: Casual    Behavior: Appropriate Motor: Normal Speech/Language: Normal Rate Affect: Appropriate Mood: normal Thought process: normal Thought content: WNL Sensory/Perceptual disturbances: WNL Orientation: oriented to person Attention: Good Concentration: Good Memory: Immediate;   Good Fund of knowledge: Good Insight: Good Judgment: Good Impulse Control: Good    Individualized Treatment Plan                Strengths: Intelligent, resourceful, loyal, good problem solver, persistent, deals with adversity, friendly, humorous  Supports: spouse, friends (mostly out of town), mother   Goal/Needs for Treatment:  In order of importance to patient 1) Learn and implement mindfulness techniques for the purposes of relapse prevention. 2) Increased  skills, effectiveness, and confidence in parenting 3) For interpersonal deficits, help the client develop new interpersonal skills and relationships.  4) Develop healthy interpersonal relationships that lead to the alleviation and help prevent  the relapse of depression.   Client Statement of Needs: Requires assistance of being more aware and mindful in his day to day life experience especially as his mood and behaviors impact his relationships with his wife and children.  A focus on relapse prevention related to depression and maintenance, .   Treatment Level: Biweekly Individual Outpatient Psychotherapy  Symptoms:  History of chronic or recurrent depression for which the client takes antidepressant medication, diminished interest in or enjoyment of activities, hypersensitivity to the criticism or disapproval of others, Reluctant involvement in social situations out of fear of saying or doing something foolish or of becoming emotional in front of others, reports difficulty in managing the challenging problem behavior of their child, social withdrawal. Displays deficits in parenting knowledge and skills and lacks knowledge regarding reasonable expectations for a child's behavior at a given developmental level.  [name].  Client Treatment Preferences: continue treatment with current therapist   [name]:  Psychologist, Hilma Favors, [name] will support the patient's ability to achieve the goals identified. Cognitive Behavioral Therapy, Dialectical Behavioral Therapy, Motivational Interviewing, behavior activation, parent training, and other evidenced-based practices will be used to promote progress towards healthy functioning.   Healthcare consumer Tarren "Joel Mayer" Mayer will: Actively participate in therapy, working towards healthy functioning.    *Justification for Continuation/Discontinuation of Goal: R=Revised, O=Ongoing, A=Achieved, D=Discontinued  Goal 1)  Learn and implement mindfulness techniques for  the purposes of relapse prevention.  5 Point Likert rating baseline date: 04/29/2021 Target Date Goal Was reviewed Status Code Progress towards goal/Likert rating  04/30/2023 04/28/2022          O 1/5 - pt has only a minimal level of the self awareness necessary to activate coping skills in a timely manner, unable to identify emotions  05/10/2024 05/11/2023          O 2/5 - pt demonstrates minimal progress, shows little self awareness necessary to activate coping skills in a timely manner, unable to identify emotions        Goal 2) Increased skills, effectiveness, and confidence in parenting  5 Point Likert rating baseline date: 04/29/2021 Target Date Goal Was reviewed Status Code Progress towards goal/Likert rating  04/30/2023 04/28/2022          O 2/5 - increased awareness of children's needs, increased level of attachment and engagement  05/10/2024 05/11/2023          O 4/5 - demonstrated the most progress in feeling more effective and engaged as a parent        Goal 3) For interpersonal deficits, help the client develop new interpersonal skills and relationships.   5 Point Likert rating baseline date: 04/29/2021 Target Date Goal Was reviewed Status Code Progress towards goal/Likert rating  04/30/2023 04/28/2022           O 1/5 - pt continues to avoid social opportunities, makes little effort to mobilize around goal to improve interpersonal interactions outside of immediate family  05/10/2024 05/11/2023           O 1/5 - pt made no progress, see need to focus on this area of his life this year        Goal 4) Develop healthy interpersonal relationships that lead to the alleviation and help prevent  the relapse of depression.    5 Point Likert rating baseline date: 04/29/2021 Target Date Goal Was reviewed Status Code Progress towards goal/Likert rating  04/30/2023 04/28/2022          O 1/5 - pt continues to avoid social opportunities, makes little effort to  mobilize around goal to improve interpersonal interactions outside of immediate family  05/10/2024 05/11/2023          O 1/5 - pt made no progress, see need to focus on this area of his life this year        This plan has been reviewed and created by the following participants:  This plan will be reviewed at least every 12 months. Date Behavioral Health Clinician Date Guardian/Patient   04/28/2022  Hilma Favors, [name]   04/28/2022 Joel Mayer  05/11/2023 Hilma Favors, [name]  05/11/2023 Joel Mayer               Diagnosis:  Generalized Anxiety Disorder Major Depressive Disorder, recurrent, in partial remission   Joel Mayer reports that he f/t and went over the household finances.  He was shocked about the amount of debt they had acquired that he was unaware of.  We d/p how upset he was with his wife, identifying that this is a pattern, recognizing his part in the situation, and steps forward.  Joel Mayer is beginning to see other areas in his life where he is disconnected and of the negative consequences of his disengagement.      Home Practice: daily journaling on prompts d/ in therapy  Hilma Favors, PhD   ========================================  Wife:  Heather Daughters:  [name] - Soph[place] @ [employer] - 8th Grade @ [address]

## 2023-12-10 ENCOUNTER — Ambulatory Visit: Payer: 59 | Admitting: Psychology

## 2023-12-10 DIAGNOSIS — F411 Generalized anxiety disorder: Secondary | ICD-10-CM

## 2023-12-10 DIAGNOSIS — F339 Major depressive disorder, recurrent, unspecified: Secondary | ICD-10-CM

## 2023-12-10 NOTE — Progress Notes (Signed)
PROGRESS NOTE:  Name: Joel Mayer Date: 12/10/2023 MRN: [id number] DOB: 23-Nov-1975 PCP: Creola Corn, MD  Time spent: 8:00 AM - 8:58 AM   Annual Review: 05/10/2024   Today I met with  Joel Mayer in remote video (Caregility) face-to-face individual psychotherapy.   Distance Site: Client's Home Orginating Site: Dr Odette Horns Remote Office Consent: Obtained verbal consent to transmit session remotely. Patient is aware of the inherent limitations in participating in virtual therapy.      Presenting Problem: Joel Mayer states that in the Fall and winter he experiences a pattern of depression consistent with SAD.  While he feels that things are better, he would like some assistance and moving forward.  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  He has been prescribed Prozac 60 mg 17 years.  He was in therapy with Dr. Christin Fudge until her retirement.  Dr. Christin Fudge referred Pt to this therapist.  Pt is married to De[place] (44) married since September of '07.  This is a first marriage for both of them. They have two daughters together. Pt reports periodic conflicts with wife and need to be more involved with his daughters.  Mental Status Exam:  Appearance: Casual    Behavior: Appropriate Motor: Normal Speech/Language: Normal Rate Affect: Appropriate Mood: normal Thought process: normal Thought content: WNL Sensory/Perceptual disturbances: WNL Orientation: oriented to person Attention: Good Concentration: Good Memory: Immediate;   Good Fund of knowledge: Good Insight: Good Judgment: Good Impulse Control: Good    Individualized Treatment Plan                Strengths: Intelligent, resourceful, loyal, good problem solver, persistent, deals with adversity, friendly, humorous  Supports: spouse, friends (mostly out of town), mother   Goal/Needs for Treatment:  In order of importance to patient 1) Learn and implement mindfulness techniques for the purposes of relapse prevention. 2) Increased  skills, effectiveness, and confidence in parenting 3) For interpersonal deficits, help the client develop new interpersonal skills and relationships.  4) Develop healthy interpersonal relationships that lead to the alleviation and help prevent  the relapse of depression.   Client Statement of Needs: Requires assistance of being more aware and mindful in his day to day life experience especially as his mood and behaviors impact his relationships with his wife and children.  A focus on relapse prevention related to depression and maintenance, .   Treatment Level: Biweekly Individual Outpatient Psychotherapy  Symptoms:  History of chronic or recurrent depression for which the client takes antidepressant medication, diminished interest in or enjoyment of activities, hypersensitivity to the criticism or disapproval of others, Reluctant involvement in social situations out of fear of saying or doing something foolish or of becoming emotional in front of others, reports difficulty in managing the challenging problem behavior of their child, social withdrawal. Displays deficits in parenting knowledge and skills and lacks knowledge regarding reasonable expectations for a child's behavior at a given developmental level.  [name].  Client Treatment Preferences: continue treatment with current therapist   [name]:  Psychologist, Hilma Favors, [name] will support the patient's ability to achieve the goals identified. Cognitive Behavioral Therapy, Dialectical Behavioral Therapy, Motivational Interviewing, behavior activation, parent training, and other evidenced-based practices will be used to promote progress towards healthy functioning.   [name] will: Actively participate in therapy, working towards healthy functioning.    *Justification for Continuation/Discontinuation of Goal: R=Revised, O=Ongoing, A=Achieved, D=Discontinued  Goal 1)  Learn and implement mindfulness techniques for  the purposes of relapse prevention.  5 Point Likert rating baseline date: 04/29/2021 Target Date Goal Was reviewed Status Code Progress towards goal/Likert rating  04/30/2023 04/28/2022          O 1/5 - pt has only a minimal level of the self awareness necessary to activate coping skills in a timely manner, unable to identify emotions  05/10/2024 05/11/2023          O 2/5 - pt demonstrates minimal progress, shows little self awareness necessary to activate coping skills in a timely manner, unable to identify emotions        Goal 2) Increased skills, effectiveness, and confidence in parenting  5 Point Likert rating baseline date: 04/29/2021 Target Date Goal Was reviewed Status Code Progress towards goal/Likert rating  04/30/2023 04/28/2022          O 2/5 - increased awareness of children's needs, increased level of attachment and engagement  05/10/2024 05/11/2023          O 4/5 - demonstrated the most progress in feeling more effective and engaged as a parent        Goal 3) For interpersonal deficits, help the client develop new interpersonal skills and relationships.   5 Point Likert rating baseline date: 04/29/2021 Target Date Goal Was reviewed Status Code Progress towards goal/Likert rating  04/30/2023 04/28/2022           O 1/5 - pt continues to avoid social opportunities, makes little effort to mobilize around goal to improve interpersonal interactions outside of immediate family  05/10/2024 05/11/2023           O 1/5 - pt made no progress, see need to focus on this area of his life this year        Goal 4) Develop healthy interpersonal relationships that lead to the alleviation and help prevent  the relapse of depression.    5 Point Likert rating baseline date: 04/29/2021 Target Date Goal Was reviewed Status Code Progress towards goal/Likert rating  04/30/2023 04/28/2022          O 1/5 - pt continues to avoid social opportunities, makes little effort to  mobilize around goal to improve interpersonal interactions outside of immediate family  05/10/2024 05/11/2023          O 1/5 - pt made no progress, see need to focus on this area of his life this year        This plan has been reviewed and created by the following participants:  This plan will be reviewed at least every 12 months. Date Behavioral Health Clinician Date Guardian/Patient   04/28/2022  Hilma Favors, [name]   04/28/2022 Joel Mayer  05/11/2023 Hilma Favors, [name]  05/11/2023 Joel Mayer               Diagnosis:  Generalized Anxiety Disorder Major Depressive Disorder, recurrent, in partial remission   Joel Mayer reached out for an extra appointment.  He was distraught about his discovery and is at a lose for how to talk to his wife about this problem with their household finances.  He is still shocked about the amount of debt they've accumulated without his knowledge.  We spent some time helping him sort out how he was feeling which was difficult, but he agreed to continue to journal.  In the meantime, we d/p some of his ideas for how to recover financially, meeting with his financial advisor to assess "the damage," and to create a financial  plan together with his wife for the future.  The most difficult issue is how to recover from feeling betrayed, and what is necessary for Herbert Seta to reestablish trust with him.  Home Practice: daily journaling on prompts d/ in therapy  Hilma Favors, PhD   ========================================  Wife: Herbert Seta Daughters:  [name] - Sophmore @ [employer] - 8th Grade @ [address]

## 2023-12-21 ENCOUNTER — Ambulatory Visit: Payer: 59 | Admitting: Psychology

## 2023-12-21 DIAGNOSIS — F3341 Major depressive disorder, recurrent, in partial remission: Secondary | ICD-10-CM | POA: Diagnosis not present

## 2023-12-21 DIAGNOSIS — F411 Generalized anxiety disorder: Secondary | ICD-10-CM

## 2023-12-21 NOTE — Progress Notes (Signed)
 PROGRESS NOTE:  Name: Joel Mayer Date: 12/21/2023 MRN: [id number] DOB: March 08, 1976 PCP: Margarete Sharps, MD  Time spent: 8:00 AM - 8:59 AM   Annual Review: 05/10/2024   Today I met with  Joel Mayer in remote video (Caregility) face-to-face individual psychotherapy.   Distance Site: Client's Home Orginating Site: Dr Durand Gift Remote Office Consent: Obtained verbal consent to transmit session remotely. Patient is aware of the inherent limitations in participating in virtual therapy.      Presenting Problem: Joel Mayer states that in the Fall and winter he experiences a pattern of depression consistent with SAD.  While he feels that things are better, he would like some assistance and moving forward.  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  He has been prescribed Prozac 60 mg 17 years.  He was in therapy with Dr. Alyson Back until her retirement.  Dr. Alyson Back referred Pt to this therapist.  Pt is married to Joel Mayer (44) married since September of '07.  This is a first marriage for both of them. They have two daughters together. Pt reports periodic conflicts with wife and need to be more involved with his daughters.  Mental Status Exam:  Appearance: Casual    Behavior: Appropriate Motor: Normal Speech/Language: Normal Rate Affect: Appropriate Mood: normal Thought process: normal Thought content: WNL Sensory/Perceptual disturbances: WNL Orientation: oriented to person Attention: Good Concentration: Good Memory: Immediate;   Good Fund of knowledge: Good Insight: Good Judgment: Good Impulse Control: Good    Individualized Treatment Plan                Strengths: Intelligent, resourceful, loyal, good problem solver, persistent, deals with adversity, friendly, humorous  Supports: spouse, friends (mostly out of town), mother   Goal/Needs for Treatment:  In order of importance to patient 1) Learn and implement mindfulness techniques for the purposes of relapse prevention. 2) Increased  skills, effectiveness, and confidence in parenting 3) For interpersonal deficits, help the client develop new interpersonal skills and relationships.  4) Develop healthy interpersonal relationships that lead to the alleviation and help prevent  the relapse of depression.   Client Statement of Needs: Requires assistance of being more aware and mindful in his day to day life experience especially as his mood and behaviors impact his relationships with his wife and children.  A focus on relapse prevention related to depression and maintenance, .   Treatment Level: Biweekly Individual Outpatient Psychotherapy  Symptoms:  History of chronic or recurrent depression for which the client takes antidepressant medication, diminished interest in or enjoyment of activities, hypersensitivity to the criticism or disapproval of others, Reluctant involvement in social situations out of fear of saying or doing something foolish or of becoming emotional in front of others, reports difficulty in managing the challenging problem behavior of their child, social withdrawal. Displays deficits in parenting knowledge and skills and lacks knowledge regarding reasonable expectations for a child's behavior at a given developmental level.  [name].  Client Treatment Preferences: continue treatment with current therapist   [name]:  Psychologist, Elder Greening, [name] will support the patient's ability to achieve the goals identified. Cognitive Behavioral Therapy, Dialectical Behavioral Therapy, Motivational Interviewing, behavior activation, parent training, and other evidenced-based practices will be used to promote progress towards healthy functioning.   Healthcare consumer Willem "Joel Mayer" Colasuonno will: Actively participate in therapy, working towards healthy functioning.    *Justification for Continuation/Discontinuation of Goal: R=Revised, O=Ongoing, A=Achieved, D=Discontinued  Goal 1)  Learn and implement mindfulness techniques for  the purposes of relapse prevention.  5 Point Likert rating baseline date: 04/29/2021 Target Date Goal Was reviewed Status Code Progress towards goal/Likert rating  04/30/2023 04/28/2022          O 1/5 - pt has only a minimal level of the self awareness necessary to activate coping skills in a timely manner, unable to identify emotions  05/10/2024 05/11/2023          O 2/5 - pt demonstrates minimal progress, shows little self awareness necessary to activate coping skills in a timely manner, unable to identify emotions        Goal 2) Increased skills, effectiveness, and confidence in parenting  5 Point Likert rating baseline date: 04/29/2021 Target Date Goal Was reviewed Status Code Progress towards goal/Likert rating  04/30/2023 04/28/2022          O 2/5 - increased awareness of children's needs, increased level of attachment and engagement  05/10/2024 05/11/2023          O 4/5 - demonstrated the most progress in feeling more effective and engaged as a parent        Goal 3) For interpersonal deficits, help the client develop new interpersonal skills and relationships.   5 Point Likert rating baseline date: 04/29/2021 Target Date Goal Was reviewed Status Code Progress towards goal/Likert rating  04/30/2023 04/28/2022           O 1/5 - pt continues to avoid social opportunities, makes little effort to mobilize around goal to improve interpersonal interactions outside of immediate family  05/10/2024 05/11/2023           O 1/5 - pt made no progress, see need to focus on this area of his life this year        Goal 4) Develop healthy interpersonal relationships that lead to the alleviation and help prevent  the relapse of depression.    5 Point Likert rating baseline date: 04/29/2021 Target Date Goal Was reviewed Status Code Progress towards goal/Likert rating  04/30/2023 04/28/2022          O 1/5 - pt continues to avoid social opportunities, makes little effort to  mobilize around goal to improve interpersonal interactions outside of immediate family  05/10/2024 05/11/2023          O 1/5 - pt made no progress, see need to focus on this area of his life this year        This plan has been reviewed and created by the following participants:  This plan will be reviewed at least every 12 months. Date Behavioral Health Clinician Date Guardian/Patient   04/28/2022  Elder Greening, [name]   04/28/2022 Dalante "Joel Mayer" Swinson  05/11/2023 Elder Greening, [name]  05/11/2023 Samul "Joel Mayer" Caison               Diagnosis:  Generalized Anxiety Disorder Major Depressive Disorder, recurrent, in partial remission   Joel Mayer reports that the same day we met, he reached out and had a long conversation with his wife.  We d/e/p what was said, what he discovered, what he was less than satisfied with and next steps.  I encouraged Joel Mayer to d/ the possibility of going to couple's counseling to d/e/p this matter in a more thorough manner because it seemed to me that there were deeper issues here.  We d/ his personal contribution to the problem, personality styles, and corrected his negative self thoughts.   Home Practice: daily journaling on prompts d/ in therapy  Elder Greening, PhD   ========================================  Wife: Pattie Borders Daughters:  [name] - Soph[place] @ [employer] - 8th Grade @ [address]

## 2024-01-04 ENCOUNTER — Ambulatory Visit (INDEPENDENT_AMBULATORY_CARE_PROVIDER_SITE_OTHER): Payer: 59 | Admitting: Psychology

## 2024-01-04 DIAGNOSIS — F320 Major depressive disorder, single episode, mild: Secondary | ICD-10-CM

## 2024-01-04 DIAGNOSIS — F324 Major depressive disorder, single episode, in partial remission: Secondary | ICD-10-CM

## 2024-01-04 DIAGNOSIS — F411 Generalized anxiety disorder: Secondary | ICD-10-CM | POA: Diagnosis not present

## 2024-01-04 NOTE — Progress Notes (Signed)
 PROGRESS NOTE:  Name: Joel Mayer Date: 01/04/2024 MRN: [id number] DOB: June 19, 1976 PCP: Creola Corn, MD  Time spent: 8:00 AM - 8:59 AM   Annual Review: 05/10/2024   Today I met with  Joel Mayer in remote video (Caregility) face-to-face individual psychotherapy.   Distance Site: Client's Home Orginating Site: Dr Odette Horns Remote Office Consent: Obtained verbal consent to transmit session remotely. Patient is aware of the inherent limitations in participating in virtual therapy.      Presenting Problem: Joel Mayer states that in the Fall and winter he experiences a pattern of depression consistent with SAD.  While he feels that things are better, he would like some assistance and moving forward.  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  He has been prescribed Prozac 60 mg 17 years.  He was in therapy with Dr. Christin Fudge until her retirement.  Dr. Christin Fudge referred Pt to this therapist.  Pt is married to Hulbert (45) married since September of '07.  This is a first marriage for both of them. They have two daughters together. Pt reports periodic conflicts with wife and need to be more involved with his daughters.  Mental Status Exam:  Appearance: Casual    Behavior: Appropriate Motor: Normal Speech/Language: Normal Rate Affect: Appropriate Mood: normal Thought process: normal Thought content: WNL Sensory/Perceptual disturbances: WNL Orientation: oriented to person Attention: Good Concentration: Good Memory: Immediate;   Good Fund of knowledge: Good Insight: Good Judgment: Good Impulse Control: Good    Individualized Treatment Plan                Strengths: Intelligent, resourceful, loyal, good problem solver, persistent, deals with adversity, friendly, humorous  Supports: spouse, friends (mostly out of town), mother   Goal/Needs for Treatment:  In order of importance to patient 1) Learn and implement mindfulness techniques for the purposes of relapse prevention. 2) Increased  skills, effectiveness, and confidence in parenting 3) For interpersonal deficits, help the client develop new interpersonal skills and relationships.  4) Develop healthy interpersonal relationships that lead to the alleviation and help prevent  the relapse of depression.   Client Statement of Needs: Requires assistance of being more aware and mindful in his day to day life experience especially as his mood and behaviors impact his relationships with his wife and children.  A focus on relapse prevention related to depression and maintenance, .   Treatment Level: Biweekly Individual Outpatient Psychotherapy  Symptoms:  History of chronic or recurrent depression for which the client takes antidepressant medication, diminished interest in or enjoyment of activities, hypersensitivity to the criticism or disapproval of others, Reluctant involvement in social situations out of fear of saying or doing something foolish or of becoming emotional in front of others, reports difficulty in managing the challenging problem behavior of their child, social withdrawal. Displays deficits in parenting knowledge and skills and lacks knowledge regarding reasonable expectations for a child's behavior at a given developmental level.  [name].  Client Treatment Preferences: continue treatment with current therapist   [name]:  Psychologist, Hilma Favors, [name] will support the patient's ability to achieve the goals identified. Cognitive Behavioral Therapy, Dialectical Behavioral Therapy, Motivational Interviewing, behavior activation, parent training, and other evidenced-based practices will be used to promote progress towards healthy functioning.   [name] will: Actively participate in therapy, working towards healthy functioning.    *Justification for Continuation/Discontinuation of Goal: R=Revised, O=Ongoing, A=Achieved, D=Discontinued  Goal 1)  Learn and implement mindfulness techniques for the  purposes of relapse prevention.  5 Point Likert rating baseline date: 04/29/2021 Target Date Goal Was reviewed Status Code Progress towards goal/Likert rating  04/30/2023 04/28/2022          O 1/5 - pt has only a minimal level of the self awareness necessary to activate coping skills in a timely manner, unable to identify emotions  05/10/2024 05/11/2023          O 2/5 - pt demonstrates minimal progress, shows little self awareness necessary to activate coping skills in a timely manner, unable to identify emotions        Goal 2) Increased skills, effectiveness, and confidence in parenting  5 Point Likert rating baseline date: 04/29/2021 Target Date Goal Was reviewed Status Code Progress towards goal/Likert rating  04/30/2023 04/28/2022          O 2/5 - increased awareness of children's needs, increased level of attachment and engagement  05/10/2024 05/11/2023          O 4/5 - demonstrated the most progress in feeling more effective and engaged as a parent        Goal 3) For interpersonal deficits, help the client develop new interpersonal skills and relationships.   5 Point Likert rating baseline date: 04/29/2021 Target Date Goal Was reviewed Status Code Progress towards goal/Likert rating  04/30/2023 04/28/2022           O 1/5 - pt continues to avoid social opportunities, makes little effort to mobilize around goal to improve interpersonal interactions outside of immediate family  05/10/2024 05/11/2023           O 1/5 - pt made no progress, see need to focus on this area of his life this year        Goal 4) Develop healthy interpersonal relationships that lead to the alleviation and help prevent  the relapse of depression.    5 Point Likert rating baseline date: 04/29/2021 Target Date Goal Was reviewed Status Code Progress towards goal/Likert rating  04/30/2023 04/28/2022          O 1/5 - pt continues to avoid social opportunities, makes little effort to  mobilize around goal to improve interpersonal interactions outside of immediate family  05/10/2024 05/11/2023          O 1/5 - pt made no progress, see need to focus on this area of his life this year        This plan has been reviewed and created by the following participants:  This plan will be reviewed at least every 12 months. Date Behavioral Health Clinician Date Guardian/Patient   04/28/2022  Hilma Favors, [name]   04/28/2022 Joel Mayer  05/11/2023 Hilma Favors, [name]  05/11/2023 Joel Mayer               Diagnosis:  Generalized Anxiety Disorder Major Depressive Disorder, recurrent, in partial remission   Joel Mayer reports that he was still feeling angry with his wife, that wasn't sure what to do about it, and worried that he wouldn't be able to let it go.  In session, we d/e/p his anxieties, deep sense of betrayal, and the broader ramifications of their financial situation.  I provided support, and provided the guidance necessary to grapple with deeper seated issues.  Home Practice: daily journaling on prompts d/ in therapy  Hilma Favors, PhD   ========================================  Wife: Herbert Seta Daughters:  [name] - Sophmore @ [employer] - 8th Grade @ [address]

## 2024-01-18 ENCOUNTER — Ambulatory Visit: Payer: 59 | Admitting: Psychology

## 2024-01-18 DIAGNOSIS — F3341 Major depressive disorder, recurrent, in partial remission: Secondary | ICD-10-CM

## 2024-01-18 DIAGNOSIS — F411 Generalized anxiety disorder: Secondary | ICD-10-CM | POA: Diagnosis not present

## 2024-01-18 NOTE — Progress Notes (Signed)
 PROGRESS NOTE:  Name: Calder Oblinger Date: 01/18/2024 MRN: [id number] DOB: Apr 16, 1976 PCP: Creola Corn, MD  Time spent: 8:00 AM - 8:58 AM   Annual Review: 05/10/2024   Today I met with  [name] in remote video (Caregility) face-to-face individual psychotherapy.   Distance Site: Client's Home Orginating Site: Dr Odette Horns Remote Office Consent: Obtained verbal consent to transmit session remotely. Patient is aware of the inherent limitations in participating in virtual therapy.      Presenting Problem: [name] states that in the Fall and winter he experiences a pattern of depression consistent with SAD.  While he feels that things are better, he would like some assistance and moving forward.  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  He has been prescribed Prozac 60 mg 17 years.  He was in therapy with Dr. Christin Fudge until her retirement.  Dr. Christin Fudge referred Pt to this therapist.  Pt is married to Harrison (45) married since September of '07.  This is a first marriage for both of them. They have two daughters together. Pt reports periodic conflicts with wife and need to be more involved with his daughters.  Mental Status Exam:  Appearance: Casual    Behavior: Appropriate Motor: Normal Speech/Language: Normal Rate Affect: Appropriate Mood: normal Thought process: normal Thought content: WNL Sensory/Perceptual disturbances: WNL Orientation: oriented to person Attention: Good Concentration: Good Memory: Immediate;   Good Fund of knowledge: Good Insight: Good Judgment: Good Impulse Control: Good    Individualized Treatment Plan                Strengths: Intelligent, resourceful, loyal, good problem solver, persistent, deals with adversity, friendly, humorous  Supports: spouse, friends (mostly out of town), mother   Goal/Needs for Treatment:  In order of importance to patient 1) Learn and implement mindfulness techniques for the purposes of relapse prevention. 2) Increased  skills, effectiveness, and confidence in parenting 3) For interpersonal deficits, help the client develop new interpersonal skills and relationships.  4) Develop healthy interpersonal relationships that lead to the alleviation and help prevent  the relapse of depression.   Client Statement of Needs: Requires assistance of being more aware and mindful in his day to day life experience especially as his mood and behaviors impact his relationships with his wife and children.  A focus on relapse prevention related to depression and maintenance, .   Treatment Level: Biweekly Individual Outpatient Psychotherapy  Symptoms:  History of chronic or recurrent depression for which the client takes antidepressant medication, diminished interest in or enjoyment of activities, hypersensitivity to the criticism or disapproval of others, Reluctant involvement in social situations out of fear of saying or doing something foolish or of becoming emotional in front of others, reports difficulty in managing the challenging problem behavior of their child, social withdrawal. Displays deficits in parenting knowledge and skills and lacks knowledge regarding reasonable expectations for a child's behavior at a given developmental level.  [name].  Client Treatment Preferences: continue treatment with current therapist   [name]:  Psychologist, Hilma Favors, [name] will support the patient's ability to achieve the goals identified. Cognitive Behavioral Therapy, Dialectical Behavioral Therapy, Motivational Interviewing, behavior activation, parent training, and other evidenced-based practices will be used to promote progress towards healthy functioning.   Healthcare consumer Pepe "[name]" Paye will: Actively participate in therapy, working towards healthy functioning.    *Justification for Continuation/Discontinuation of Goal: R=Revised, O=Ongoing, A=Achieved, D=Discontinued  Goal 1)  Learn and implement mindfulness techniques for the  purposes of relapse prevention.  5 Point Likert rating baseline date: 04/29/2021 Target Date Goal Was reviewed Status Code Progress towards goal/Likert rating  04/30/2023 04/28/2022          O 1/5 - pt has only a minimal level of the self awareness necessary to activate coping skills in a timely manner, unable to identify emotions  05/10/2024 05/11/2023          O 2/5 - pt demonstrates minimal progress, shows little self awareness necessary to activate coping skills in a timely manner, unable to identify emotions        Goal 2) Increased skills, effectiveness, and confidence in parenting  5 Point Likert rating baseline date: 04/29/2021 Target Date Goal Was reviewed Status Code Progress towards goal/Likert rating  04/30/2023 04/28/2022          O 2/5 - increased awareness of children's needs, increased level of attachment and engagement  05/10/2024 05/11/2023          O 4/5 - demonstrated the most progress in feeling more effective and engaged as a parent        Goal 3) For interpersonal deficits, help the client develop new interpersonal skills and relationships.   5 Point Likert rating baseline date: 04/29/2021 Target Date Goal Was reviewed Status Code Progress towards goal/Likert rating  04/30/2023 04/28/2022           O 1/5 - pt continues to avoid social opportunities, makes little effort to mobilize around goal to improve interpersonal interactions outside of immediate family  05/10/2024 05/11/2023           O 1/5 - pt made no progress, see need to focus on this area of his life this year        Goal 4) Develop healthy interpersonal relationships that lead to the alleviation and help prevent  the relapse of depression.    5 Point Likert rating baseline date: 04/29/2021 Target Date Goal Was reviewed Status Code Progress towards goal/Likert rating  04/30/2023 04/28/2022          O 1/5 - pt continues to avoid social opportunities, makes little effort to  mobilize around goal to improve interpersonal interactions outside of immediate family  05/10/2024 05/11/2023          O 1/5 - pt made no progress, see need to focus on this area of his life this year        This plan has been reviewed and created by the following participants:  This plan will be reviewed at least every 12 months. Date Behavioral Health Clinician Date Guardian/Patient   04/28/2022  Hilma Favors, [name]   04/28/2022 Heinz "[name]" Mcdonnell  05/11/2023 Hilma Favors, [name]  05/11/2023 Colbie "[name]" Viloria               Diagnosis:  Generalized Anxiety Disorder Major Depressive Disorder, recurrent, in partial remission   [name] reports that his youngest daughter had a spinal fusion for her scoliosis this past week.  We d/p how the surgery went, how her recovery is progressing, and the challenges of dealing with a "confined" teenager.  We also d/p his f/u d/ with his wife leading up to their meeting with their financial advisor and the outcomes of their meeting with their financial advisor.  Lastly, frustration that yet another two weeks have gone by and he is still struggling to get back into an exercise routine.  I provided support and offered some practical strategies for restarting his  exercise routine.   Home Practice: daily journaling on prompts d/ in therapy  Hilma Favors, PhD   ========================================  Wife: Herbert Seta Daughters:  [name] - Soph[employer] @ [employer] - 8th Grade @ [address]

## 2024-02-01 ENCOUNTER — Ambulatory Visit (INDEPENDENT_AMBULATORY_CARE_PROVIDER_SITE_OTHER): Payer: 59 | Admitting: Psychology

## 2024-02-01 DIAGNOSIS — F3341 Major depressive disorder, recurrent, in partial remission: Secondary | ICD-10-CM

## 2024-02-01 DIAGNOSIS — F320 Major depressive disorder, single episode, mild: Secondary | ICD-10-CM

## 2024-02-01 DIAGNOSIS — F411 Generalized anxiety disorder: Secondary | ICD-10-CM | POA: Diagnosis not present

## 2024-02-01 NOTE — Progress Notes (Signed)
 PROGRESS NOTE:  Name: Joel Mayer Date: 02/01/2024 MRN: [id number] DOB: 06-12-1976 PCP: Joel Corn, MD  Time spent: 8:00 AM - 8:58 AM   Annual Review: 05/10/2024   Today I met with  Joel Mayer in remote video (Caregility) face-to-face individual psychotherapy.   Distance Site: Client's Home Orginating Site: Dr Joel Mayer Remote Office Consent: Obtained verbal consent to transmit session remotely. Patient is aware of the inherent limitations in participating in virtual therapy.      Presenting Problem: Joel Mayer states that in the Fall and winter he experiences a pattern of depression consistent with SAD.  While he feels that things are better, he would like some assistance and moving forward.  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  He has been prescribed Prozac 60 mg 17 years.  He was in therapy with Dr. Christin Mayer until her retirement.  Dr. Christin Mayer referred Pt to this therapist.  Pt is married to Joel Mayer (44) married since September of '07.  This is a first marriage for both of them. They have two daughters together. Pt reports periodic conflicts with wife and need to be more involved with his daughters.  Mental Status Exam:  Appearance: Casual    Behavior: Appropriate Motor: Normal Speech/Language: Normal Rate Affect: Appropriate Mood: normal Thought process: normal Thought content: WNL Sensory/Perceptual disturbances: WNL Orientation: oriented to person Attention: Good Concentration: Good Memory: Immediate;   Good Fund of knowledge: Good Insight: Good Judgment: Good Impulse Control: Good    Individualized Treatment Plan                Strengths: Intelligent, resourceful, loyal, good problem solver, persistent, deals with adversity, friendly, humorous  Supports: spouse, friends (mostly out of town), mother   Goal/Needs for Treatment:  In order of importance to patient 1) Learn and implement mindfulness techniques for the purposes of relapse prevention. 2) Increased  skills, effectiveness, and confidence in parenting 3) For interpersonal deficits, help the client develop new interpersonal skills and relationships.  4) Develop healthy interpersonal relationships that lead to the alleviation and help prevent  the relapse of depression.   Client Statement of Needs: Requires assistance of being more aware and mindful in his day to day life experience especially as his mood and behaviors impact his relationships with his wife and children.  A focus on relapse prevention related to depression and maintenance, .   Treatment Level: Biweekly Individual Outpatient Psychotherapy  Symptoms:  History of chronic or recurrent depression for which the client takes antidepressant medication, diminished interest in or enjoyment of activities, hypersensitivity to the criticism or disapproval of others, Reluctant involvement in social situations out of fear of saying or doing something foolish or of becoming emotional in front of others, reports difficulty in managing the challenging problem behavior of their child, social withdrawal. Displays deficits in parenting knowledge and skills and lacks knowledge regarding reasonable expectations for a child's behavior at a given developmental level.  [name].  Client Treatment Preferences: continue treatment with current therapist   [name]:  Psychologist, Joel Mayer, [name] will support the patient's ability to achieve the goals identified. Cognitive Behavioral Therapy, Dialectical Behavioral Therapy, Motivational Interviewing, behavior activation, parent training, and other evidenced-based practices will be used to promote progress towards healthy functioning.   [name] will: Actively participate in therapy, working towards healthy functioning.    *Justification for Continuation/Discontinuation of Goal: R=Revised, O=Ongoing, A=Achieved, D=Discontinued  Goal 1)  Learn and implement mindfulness techniques for the  purposes of relapse prevention.  5 Point Likert rating baseline date: 04/29/2021 Target Date Goal Was reviewed Status Code Progress towards goal/Likert rating  04/30/2023 04/28/2022          O 1/5 - pt has only a minimal level of the self awareness necessary to activate coping skills in a timely manner, unable to identify emotions  05/10/2024 05/11/2023          O 2/5 - pt demonstrates minimal progress, shows little self awareness necessary to activate coping skills in a timely manner, unable to identify emotions        Goal 2) Increased skills, effectiveness, and confidence in parenting  5 Point Likert rating baseline date: 04/29/2021 Target Date Goal Was reviewed Status Code Progress towards goal/Likert rating  04/30/2023 04/28/2022          O 2/5 - increased awareness of children's needs, increased level of attachment and engagement  05/10/2024 05/11/2023          O 4/5 - demonstrated the most progress in feeling more effective and engaged as a parent        Goal 3) For interpersonal deficits, help the client develop new interpersonal skills and relationships.   5 Point Likert rating baseline date: 04/29/2021 Target Date Goal Was reviewed Status Code Progress towards goal/Likert rating  04/30/2023 04/28/2022           O 1/5 - pt continues to avoid social opportunities, makes little effort to mobilize around goal to improve interpersonal interactions outside of immediate family  05/10/2024 05/11/2023           O 1/5 - pt made no progress, see need to focus on this area of his life this year        Goal 4) Develop healthy interpersonal relationships that lead to the alleviation and help prevent  the relapse of depression.    5 Point Likert rating baseline date: 04/29/2021 Target Date Goal Was reviewed Status Code Progress towards goal/Likert rating  04/30/2023 04/28/2022          O 1/5 - pt continues to avoid social opportunities, makes little effort to  mobilize around goal to improve interpersonal interactions outside of immediate family  05/10/2024 05/11/2023          O 1/5 - pt made no progress, see need to focus on this area of his life this year        This plan has been reviewed and created by the following participants:  This plan will be reviewed at least every 12 months. Date Behavioral Health Clinician Date Guardian/Patient   04/28/2022  Joel Mayer, [name]   04/28/2022 Joel Mayer  05/11/2023 Joel Mayer, [name]  05/11/2023 Joel Mayer               Diagnosis:  Generalized Anxiety Disorder Major Depressive Disorder, recurrent, in partial remission   Joel Mayer reports that his youngest daughter is recovering well from her spinal fusion.  We d/p sone of the challenges she and the family are facing as she continues to recover and slowly return to school.  Joel Mayer had more concerns for his oldest daughter this week.  We d/e/p what occurred, how he felt, and how he responded.  I offered another perspective as it pertained to his daughter's ADHD and how it could potentially impact her social life.  He appreciated the insight.  Lastly, Joel Mayer c/o of feeling depressed and had questions about it's roots.  We d/e/p  and I made some connections between the past and the present.   Home Practice: daily journaling on prompts d/ in therapy  Joel Favors, PhD   ========================================  Wife: Herbert Seta Daughters:  [name] - Sophmore @ [employer] [place] - 8th Grade @ [address]

## 2024-02-01 NOTE — Progress Notes (Signed)
   Hilma Favors, PhD

## 2024-02-15 ENCOUNTER — Ambulatory Visit (INDEPENDENT_AMBULATORY_CARE_PROVIDER_SITE_OTHER): Payer: 59 | Admitting: Psychology

## 2024-02-15 DIAGNOSIS — F3341 Major depressive disorder, recurrent, in partial remission: Secondary | ICD-10-CM | POA: Diagnosis not present

## 2024-02-15 DIAGNOSIS — F320 Major depressive disorder, single episode, mild: Secondary | ICD-10-CM

## 2024-02-15 DIAGNOSIS — F411 Generalized anxiety disorder: Secondary | ICD-10-CM

## 2024-02-15 NOTE — Progress Notes (Signed)
 PROGRESS NOTE:  Name: Joel Mayer Date: 02/15/2024 MRN: [id number] DOB: 11-06-1976 PCP: Creola Corn, MD  Time spent: 8:00 AM - 8:58 AM   Annual Review: 05/10/2024   Today I met with  Joel Mayer in remote video ([facility]) face-to-face individual psychotherapy.   Distance Site: Client's Home Orginating Site: Dr Odette Horns Remote Office Consent: Obtained verbal consent to transmit session remotely. Patient is aware of the inherent limitations in participating in virtual therapy.      Presenting Problem: Joel Mayer states that in the Fall and winter he experiences a pattern of depression consistent with SAD.  While he feels that things are better, he would like some assistance and moving forward.  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  He has been prescribed Prozac 60 mg 17 years.  He was in therapy with Dr. Christin Fudge until her retirement.  Dr. Christin Fudge referred Pt to this therapist.  Pt is married to Elysian (45) married since September of '07.  This is a first marriage for both of them. They have two daughters together. Pt reports periodic conflicts with wife and need to be more involved with his daughters.  Mental Status Exam:  Appearance: Casual    Behavior: Appropriate Motor: Normal Speech/Language: Normal Rate Affect: Appropriate Mood: normal Thought process: normal Thought content: WNL Sensory/Perceptual disturbances: WNL Orientation: oriented to person Attention: Good Concentration: Good Memory: Immediate;   Good Fund of knowledge: Good Insight: Good Judgment: Good Impulse Control: Good    Individualized Treatment Plan                Strengths: Intelligent, resourceful, loyal, good problem solver, persistent, deals with adversity, friendly, humorous  Supports: spouse, friends (mostly out of town), mother   Goal/Needs for Treatment:  In order of importance to patient 1) Learn and implement mindfulness techniques for the purposes of relapse prevention. 2) Increased  skills, effectiveness, and confidence in parenting 3) For interpersonal deficits, help the client develop new interpersonal skills and relationships.  4) Develop healthy interpersonal relationships that lead to the alleviation and help prevent  the relapse of depression.   Client Statement of Needs: Requires assistance of being more aware and mindful in his day to day life experience especially as his mood and behaviors impact his relationships with his wife and children.  A focus on relapse prevention related to depression and maintenance, .   Treatment Level: Biweekly Individual Outpatient Psychotherapy  Symptoms:  History of chronic or recurrent depression for which the client takes antidepressant medication, diminished interest in or enjoyment of activities, hypersensitivity to the criticism or disapproval of others, Reluctant involvement in social situations out of fear of saying or doing something foolish or of becoming emotional in front of others, reports difficulty in managing the challenging problem behavior of their child, social withdrawal. Displays deficits in parenting knowledge and skills and lacks knowledge regarding reasonable expectations for a child's behavior at a given developmental level.  [name].  Client Treatment Preferences: continue treatment with current therapist   [name]:  Psychologist, Hilma Favors, [name] will support the patient's ability to achieve the goals identified. Cognitive Behavioral Therapy, Dialectical Behavioral Therapy, Motivational Interviewing, behavior activation, parent training, and other evidenced-based practices will be used to promote progress towards healthy functioning.   [name] will: Actively participate in therapy, working towards healthy functioning.    *Justification for Continuation/Discontinuation of Goal: R=Revised, O=Ongoing, A=Achieved, D=Discontinued  Goal 1)  Learn and implement mindfulness techniques for the  purposes of relapse prevention.  5 Point Likert rating baseline date: 04/29/2021 Target Date Goal Was reviewed Status Code Progress towards goal/Likert rating  04/30/2023 04/28/2022          O 1/5 - pt has only a minimal level of the self awareness necessary to activate coping skills in a timely manner, unable to identify emotions  05/10/2024 05/11/2023          O 2/5 - pt demonstrates minimal progress, shows little self awareness necessary to activate coping skills in a timely manner, unable to identify emotions        Goal 2) Increased skills, effectiveness, and confidence in parenting  5 Point Likert rating baseline date: 04/29/2021 Target Date Goal Was reviewed Status Code Progress towards goal/Likert rating  04/30/2023 04/28/2022          O 2/5 - increased awareness of children's needs, increased level of attachment and engagement  05/10/2024 05/11/2023          O 4/5 - demonstrated the most progress in feeling more effective and engaged as a parent        Goal 3) For interpersonal deficits, help the client develop new interpersonal skills and relationships.   5 Point Likert rating baseline date: 04/29/2021 Target Date Goal Was reviewed Status Code Progress towards goal/Likert rating  04/30/2023 04/28/2022           O 1/5 - pt continues to avoid social opportunities, makes little effort to mobilize around goal to improve interpersonal interactions outside of immediate family  05/10/2024 05/11/2023           O 1/5 - pt made no progress, see need to focus on this area of his life this year        Goal 4) Develop healthy interpersonal relationships that lead to the alleviation and help prevent  the relapse of depression.    5 Point Likert rating baseline date: 04/29/2021 Target Date Goal Was reviewed Status Code Progress towards goal/Likert rating  04/30/2023 04/28/2022          O 1/5 - pt continues to avoid social opportunities, makes little effort to  mobilize around goal to improve interpersonal interactions outside of immediate family  05/10/2024 05/11/2023          O 1/5 - pt made no progress, see need to focus on this area of his life this year        This plan has been reviewed and created by the following participants:  This plan will be reviewed at least every 12 months. Date Behavioral Health Clinician Date Guardian/Patient   04/28/2022  Hilma Favors, [name]   04/28/2022 Joel Mayer  05/11/2023 Hilma Favors, [name]  05/11/2023 Joel Mayer               Diagnosis:  Generalized Anxiety Disorder Major Depressive Disorder, recurrent, in partial remission   Joel Mayer reports that his youngest daughter continues to recover from her spinal fusion.  We d/p some new challenges she and the family are facing as she continues to recover and returns to school full time.  We d/p Craig's concerns for his oldest daughter with a particular social group is heating up as a "group" families vacation is approaching.  Their was an incident related to his daughter in which his wife "pushed" him to do something he wasn't happy about.  We d/e/p and we made connections between the present and the past.  The bigger issue we d/e/p  was his anxiety that his job might be in jeopardy and his ambivalence around looking for another job.  Home Practice: daily journaling on prompts d/ in therapy  Hilma Favors, PhD   ========================================  Wife: Herbert Seta Daughters:  [name] - [employer] @ [employer] - 8th Grade @ [address]

## 2024-02-29 ENCOUNTER — Encounter: Payer: Self-pay | Admitting: Neurology

## 2024-02-29 ENCOUNTER — Encounter: Payer: Self-pay | Admitting: *Deleted

## 2024-02-29 ENCOUNTER — Ambulatory Visit (INDEPENDENT_AMBULATORY_CARE_PROVIDER_SITE_OTHER): Admitting: Neurology

## 2024-02-29 ENCOUNTER — Ambulatory Visit: Payer: 59 | Admitting: Psychology

## 2024-02-29 VITALS — BP 155/99 | HR 92 | Ht 71.0 in | Wt 218.0 lb

## 2024-02-29 DIAGNOSIS — E66811 Obesity, class 1: Secondary | ICD-10-CM

## 2024-02-29 DIAGNOSIS — Z820 Family history of epilepsy and other diseases of the nervous system: Secondary | ICD-10-CM

## 2024-02-29 DIAGNOSIS — R0689 Other abnormalities of breathing: Secondary | ICD-10-CM

## 2024-02-29 DIAGNOSIS — R030 Elevated blood-pressure reading, without diagnosis of hypertension: Secondary | ICD-10-CM | POA: Diagnosis not present

## 2024-02-29 DIAGNOSIS — R0683 Snoring: Secondary | ICD-10-CM

## 2024-02-29 DIAGNOSIS — Z9189 Other specified personal risk factors, not elsewhere classified: Secondary | ICD-10-CM | POA: Diagnosis not present

## 2024-02-29 NOTE — Patient Instructions (Signed)

## 2024-02-29 NOTE — Progress Notes (Signed)
 Subjective:    Patient ID: Wash Nienhaus is a 48 y.o. male.  HPI    Debbra Fairy, MD, PhD [facility] [address] [address] [place], [place] [address]  Dear Illene Malm,   I saw your patient, Aquil Duhe, upon your kind request in my sleep clinic today for initial consultation of his sleep disorder, in particular, concern for underlying obstructive sleep apnea.  The patient is unaccompanied today.  As you know, Mr. Shiffler is a 48 year old male with an underlying medical history of hypertension, tinnitus, anxiety, depression, and mild obesity, who reports snoring and excessive daytime somnolence as well as waking up with a sense of gasping for air.  His Epworth sleepiness score is 2 out of 24, fatigue severity score is 11 out of 63.  He reports a family history of sleep apnea on his father side.  Patient lives with his wife and 2 children, ages 1 and 19.  They have 1 dog in the household, the dog does not sleep in their bedroom at night.  He does not have a TV in his bedroom.  I reviewed your office visit note from 08/18/2023.   His bedtime is generally around 10 and rise time between 6 and 6:30 AM.  He denies nightly nocturia or recurrent nocturnal or morning headaches.  He works as a Clinical research associate for [employer].  He quit smoking in 1999.  He drinks alcohol daily or nearly daily, about 5-10 drinks per week on average.  He drinks caffeine in the form of coffee, 1 or 2 cups/day.  His blood pressure is a little elevated today, he attributes this to stress recently, he is not actually on any blood pressure medication.  His blood pressure values have fluctuated in the past few months.  He has had slow weight gain over the past 5 years.  He uses an over-the-counter bite guard for bruxism.  He previously had a dentist made occlusive guard.  His Past Medical History Is Significant For: Past Medical History:  Diagnosis Date   Anxiety    controlled   Depression    controlled    Hx of adenomatous polyp of colon 11/11/2021   Hyperlipidemia    controlled   Tinnitus     His Past Surgical History Is Significant For: Past Surgical History:  Procedure Laterality Date   COLONOSCOPY W/ POLYPECTOMY  10/2021   ELBOW SURGERY Right    s/p eyelid growth removal     TOE SURGERY Right    Rt big toe x 2   VASECTOMY     WISDOM TOOTH EXTRACTION      His Family History Is Significant For: Family History  Problem Relation Age of Onset   Cataracts Mother    Hearing loss Mother    Fibromyalgia Mother    CVA Father    Diabetes Father    Sleep apnea Father    Sleep apnea Paternal Grandfather    Sleep apnea Paternal Uncle    Sleep apnea Paternal Uncle    Colon cancer Neg Hx    Colon polyps Neg Hx    Esophageal cancer Neg Hx    Rectal cancer Neg Hx    Stomach cancer Neg Hx     His Social History Is Significant For: Social History   Socioeconomic History   Marital status: Married    Spouse name: Not on file   Number of children: 2   Years of education: lawyer   Highest education level: Not on  file  Occupational History   Not on file  Tobacco Use   Smoking status: Former    Current packs/day: 0.00    Types: Cigarettes    Quit date: 11/10/1997    Years since quitting: 26.3   Smokeless tobacco: Never  Vaping Use   Vaping status: Never Used  Substance and Sexual Activity   Alcohol use: Yes    Alcohol/week: 5.0 - 10.0 standard drinks of alcohol    Types: 5 - 10 Standard drinks or equivalent per week   Drug use: Never   Sexual activity: Not on file  Other Topics Concern   Not on file  Social History Narrative   Right handed   Lives at home with spouse   Caffeine: 1-2 cups coffee/day   Social Drivers of Corporate investment banker Strain: Not on file  Food Insecurity: Not on file  Transportation Needs: Not on file  Physical Activity: Not on file  Stress: Not on file  Social Connections: Not on file    His Allergies Are:  No Known Allergies:    His Current Medications Are:  Outpatient Encounter Medications as of 02/29/2024  Medication Sig   atorvastatin (LIPITOR) 20 MG tablet Take 20 mg by mouth daily.   buPROPion (WELLBUTRIN XL) 300 MG 24 hr tablet Take 300 mg by mouth daily.   escitalopram (LEXAPRO) 20 MG tablet Take 20 mg by mouth daily.   Multiple Vitamin (MULTIVITAMIN) tablet Take by mouth.   [DISCONTINUED] escitalopram (LEXAPRO) 10 MG tablet Take 10 mg by mouth daily.   [DISCONTINUED] FLUoxetine (PROZAC) 40 MG capsule Take 80 mg by mouth daily. (Patient not taking: Reported on 02/29/2024)   [DISCONTINUED] OVER THE COUNTER MEDICATION ? Name of herbal OTC vitamin   [DISCONTINUED] TURMERIC PO Take by mouth.   No facility-administered encounter medications on file as of 02/29/2024.  :   Review of Systems:  Out of a complete 14 point review of systems, all are reviewed and negative with the exception of these symptoms as listed below:  Review of Systems  Neurological:        Patient is here alone for sleep consult. He denies any major issues with sleep but does endorse snoring. He states he has caught himself unable to breathe but states his wife has never mentioned any apnea while sleeping. He has never had a sleep study. He has family history of sleep apnea on his father's side (father, grandfather, 2 uncles). ESS 2 FSS 11    Objective:  Neurological Exam  Physical Exam Physical Examination:   Vitals:   02/29/24 1351  BP: (!) 155/99  Pulse: 92    General Examination: The patient is a very pleasant 48 y.o. male in no acute distress. He appears well-developed and well-nourished and well groomed.   HEENT: Normocephalic, atraumatic, pupils are equal, round and reactive to light, extraocular tracking is good without limitation to gaze excursion or nystagmus noted. Hearing is grossly intact. Face is symmetric with normal facial animation. Speech is clear with no dysarthria noted. There is no hypophonia. There is no lip,  neck/head, jaw or voice tremor. Neck is supple with full range of passive and active motion. There are no carotid bruits on auscultation. Oropharynx exam reveals: mild mouth dryness, good dental hygiene and moderate airway crowding, due to small airway entry, tonsils about 1+ bilaterally, elongated tongue.  Mallampati class II.  Neck circumference 17 7/8 inches, minimal overbite noted.  Tongue protrudes centrally and palate elevates symmetrically.  Chest:  Clear to auscultation without wheezing, rhonchi or crackles noted.  Heart: S1+S2+0, regular and normal without murmurs, rubs or gallops noted.   Abdomen: Soft, non-tender and non-distended.  Extremities: There is no pitting edema in the distal lower extremities bilaterally.   Skin: Warm and dry without trophic changes noted.   Musculoskeletal: exam reveals no obvious joint deformities.   Neurologically:  Mental status: The patient is awake, alert and oriented in all 4 spheres. His immediate and remote memory, attention, language skills and fund of knowledge are appropriate. There is no evidence of aphasia, agnosia, apraxia or anomia. Speech is clear with normal prosody and enunciation. Thought process is linear. Mood is normal and affect is normal.  Cranial nerves II - XII are as described above under HEENT exam.  Motor exam: Normal bulk, moving all 4 extremities without limitation, no obvious action or resting tremor.  Fine motor skills and coordination: grossly intact.  Cerebellar testing: No dysmetria or intention tremor. There is no truncal or gait ataxia.  Sensory exam: intact to light touch in the upper and lower extremities.  Gait, station and balance: He stands easily. No veering to one side is noted. No leaning to one side is noted. Posture is age-appropriate and stance is narrow based. Gait shows normal stride length and normal pace. No problems turning are noted.   Assessment and plan:  In summary, Divine Hansley is a very pleasant 48  y.o.-year old male 48 year old male with an underlying medical history of hypertension, tinnitus, anxiety, depression, and mild obesity, whose history and physical exam are concerning for sleep disordered breathing, particularly obstructive sleep apnea (OSA). A laboratory attended sleep study is typically considered "gold standard" for evaluation of sleep disordered breathing.   I had a long chat with the patient about my findings and the diagnosis of sleep apnea, particularly OSA, its prognosis and treatment options. We talked about medical/conservative treatments, surgical interventions and non-pharmacological approaches for symptom control. I explained, in particular, the risks and ramifications of untreated moderate to severe OSA, especially with respect to developing cardiovascular disease down the road, including congestive heart failure (CHF), difficult to treat hypertension, cardiac arrhythmias (particularly A-fib), neurovascular complications including TIA, stroke and dementia. Even type 2 diabetes has, in part, been linked to untreated OSA. Symptoms of untreated OSA may include (but may not be limited to) daytime sleepiness, nocturia (i.e. frequent nighttime urination), memory problems, mood irritability and suboptimally controlled or worsening mood disorder such as depression and/or anxiety, lack of energy, lack of motivation, physical discomfort, as well as recurrent headaches, especially morning or nocturnal headaches. We talked about the importance of maintaining a healthy lifestyle and striving for healthy weight.  In addition, we talked about the importance of striving for and maintaining good sleep hygiene. I recommended a sleep study at this time. I outlined the differences between a laboratory attended sleep study which is considered more comprehensive and accurate over the option of a home sleep test (HST); the latter may lead to underestimation of sleep disordered breathing in some instances  and does not help with diagnosing upper airway resistance syndrome and is not accurate enough to diagnose primary central sleep apnea typically. I outlined possible surgical and non-surgical treatment options of OSA, including the use of a positive airway pressure (PAP) device (i.e. CPAP, AutoPAP/APAP or BiPAP in certain circumstances), a custom-made dental device (aka oral appliance, which would require a referral to a specialist dentist or orthodontist typically, and is generally speaking not considered for patients with  full dentures or edentulous state), upper airway surgical options, such as traditional UPPP (which is not considered a first-line treatment) or the Inspire device (hypoglossal nerve stimulator, which would involve a referral for consultation with an ENT surgeon, after careful selection, following inclusion criteria - also not first-line treatment). I explained the PAP treatment option to the patient in detail, as this is generally considered first-line treatment.  The patient indicated that he would be willing to try PAP therapy, if the need arises. I explained the importance of being compliant with PAP treatment, not only for insurance purposes but primarily to improve patient's symptoms symptoms, and for the patient's long term health benefit, including to reduce His cardiovascular risks longer-term.    We will pick up our discussion about the next steps and treatment options after testing.  We will keep him posted as to the test results by phone call and/or MyChart messaging where possible.  We will plan to follow-up in sleep clinic accordingly as well.  I answered all his questions today and the patient was in agreement.   I encouraged him to call with any interim questions, concerns, problems or updates or email us  through MyChart.  Generally speaking, sleep test authorizations may take up to 2 weeks, sometimes less, sometimes longer, the patient is encouraged to get in touch with us  if  they do not hear back from the sleep lab staff directly within the next 2 weeks.  Thank you very much for allowing me to participate in the care of this nice patient. If I can be of any further assistance to you please do not hesitate to call me at [phone].  Sincerely,   Debbra Fairy, MD, PhD

## 2024-03-02 ENCOUNTER — Ambulatory Visit (INDEPENDENT_AMBULATORY_CARE_PROVIDER_SITE_OTHER): Admitting: Psychology

## 2024-03-02 ENCOUNTER — Telehealth: Payer: Self-pay | Admitting: Neurology

## 2024-03-02 DIAGNOSIS — F4010 Social phobia, unspecified: Secondary | ICD-10-CM

## 2024-03-02 DIAGNOSIS — F411 Generalized anxiety disorder: Secondary | ICD-10-CM

## 2024-03-02 DIAGNOSIS — F3341 Major depressive disorder, recurrent, in partial remission: Secondary | ICD-10-CM

## 2024-03-02 NOTE — Progress Notes (Signed)
 PROGRESS NOTE:  Name: Joel Mayer Date: 03/02/2024 MRN: [id number] DOB: 1976/01/16 PCP: Margarete Sharps, MD  Time spent: 11:00 AM - 11:58 AM   Annual Review: 05/10/2024   Today I met with  Joel Mayer in remote video (Caregility) face-to-face individual psychotherapy.   Distance Site: Client's Home Orginating Site: Dr Durand Gift Remote Office Consent: Obtained verbal consent to transmit session remotely. Patient is aware of the inherent limitations in participating in virtual therapy.      Presenting Problem: Joel Mayer states that in the Fall and winter he experiences a pattern of depression consistent with SAD.  While he feels that things are better, he would like some assistance and moving forward.  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  He has been prescribed Prozac 60 mg 17 years.  He was in therapy with Dr. Alyson Back until her retirement.  Dr. Alyson Back referred Pt to this therapist.  Pt is married to Bartlett (44) married since September of '07.  This is a first marriage for both of them. They have two daughters together. Pt reports periodic conflicts with wife and need to be more involved with his daughters.  Mental Status Exam:  Appearance: Casual    Behavior: Appropriate Motor: Normal Speech/Language: Normal Rate Affect: Appropriate Mood: normal Thought process: normal Thought content: WNL Sensory/Perceptual disturbances: WNL Orientation: oriented to person Attention: Good Concentration: Good Memory: Immediate;   Good Fund of knowledge: Good Insight: Good Judgment: Good Impulse Control: Good    Individualized Treatment Plan                Strengths: Intelligent, resourceful, loyal, good problem solver, persistent, deals with adversity, friendly, humorous  Supports: spouse, friends (mostly out of town), mother   Goal/Needs for Treatment:  In order of importance to patient 1) Learn and implement mindfulness techniques for the purposes of relapse prevention. 2) Increased  skills, effectiveness, and confidence in parenting 3) For interpersonal deficits, help the client develop new interpersonal skills and relationships.  4) Develop healthy interpersonal relationships that lead to the alleviation and help prevent  the relapse of depression.   Client Statement of Needs: Requires assistance of being more aware and mindful in his day to day life experience especially as his mood and behaviors impact his relationships with his wife and children.  A focus on relapse prevention related to depression and maintenance, .   Treatment Level: Biweekly Individual Outpatient Psychotherapy  Symptoms:  History of chronic or recurrent depression for which the client takes antidepressant medication, diminished interest in or enjoyment of activities, hypersensitivity to the criticism or disapproval of others, Reluctant involvement in social situations out of fear of saying or doing something foolish or of becoming emotional in front of others, reports difficulty in managing the challenging problem behavior of their child, social withdrawal. Displays deficits in parenting knowledge and skills and lacks knowledge regarding reasonable expectations for a child's behavior at a given developmental level.  [name].  Client Treatment Preferences: continue treatment with current therapist   [name]:  Psychologist, Elder Greening, [name] will support the patient's ability to achieve the goals identified. Cognitive Behavioral Therapy, Dialectical Behavioral Therapy, Motivational Interviewing, behavior activation, parent training, and other evidenced-based practices will be used to promote progress towards healthy functioning.   [name] will: Actively participate in therapy, working towards healthy functioning.    *Justification for Continuation/Discontinuation of Goal: R=Revised, O=Ongoing, A=Achieved, D=Discontinued  Goal 1)  Learn and implement mindfulness techniques for the  purposes of relapse prevention.  5 Point Likert rating baseline date: 04/29/2021 Target Date Goal Was reviewed Status Code Progress towards goal/Likert rating  04/30/2023 04/28/2022          O 1/5 - pt has only a minimal level of the self awareness necessary to activate coping skills in a timely manner, unable to identify emotions  05/10/2024 05/11/2023          O 2/5 - pt demonstrates minimal progress, shows little self awareness necessary to activate coping skills in a timely manner, unable to identify emotions        Goal 2) Increased skills, effectiveness, and confidence in parenting  5 Point Likert rating baseline date: 04/29/2021 Target Date Goal Was reviewed Status Code Progress towards goal/Likert rating  04/30/2023 04/28/2022          O 2/5 - increased awareness of children's needs, increased level of attachment and engagement  05/10/2024 05/11/2023          O 4/5 - demonstrated the most progress in feeling more effective and engaged as a parent        Goal 3) For interpersonal deficits, help the client develop new interpersonal skills and relationships.   5 Point Likert rating baseline date: 04/29/2021 Target Date Goal Was reviewed Status Code Progress towards goal/Likert rating  04/30/2023 04/28/2022           O 1/5 - pt continues to avoid social opportunities, makes little effort to mobilize around goal to improve interpersonal interactions outside of immediate family  05/10/2024 05/11/2023           O 1/5 - pt made no progress, see need to focus on this area of his life this year        Goal 4) Develop healthy interpersonal relationships that lead to the alleviation and help prevent  the relapse of depression.    5 Point Likert rating baseline date: 04/29/2021 Target Date Goal Was reviewed Status Code Progress towards goal/Likert rating  04/30/2023 04/28/2022          O 1/5 - pt continues to avoid social opportunities, makes little effort to  mobilize around goal to improve interpersonal interactions outside of immediate family  05/10/2024 05/11/2023          O 1/5 - pt made no progress, see need to focus on this area of his life this year        This plan has been reviewed and created by the following participants:  This plan will be reviewed at least every 12 months. Date Behavioral Health Clinician Date Guardian/Patient   04/28/2022  Elder Greening, [name]   04/28/2022 Joel Mayer  05/11/2023 Elder Greening, [name]  05/11/2023 Joel Mayer               Diagnosis:  Generalized Anxiety Disorder Major Depressive Disorder, recurrent, in partial remission   Joel Mayer reports that their vacation in [place]  did not go well.  We d/e/p what occurred, how angry he felt, recognizing that he was in a situation he wanted nothing to do with, feeling resentful, and not understanding the depths of his anger.  He reported that he had a doctor's appointment the next day and his blood pressure was 150/90.  We d/e/p these situations, his response, and made connections between the present and the past.  By the end of the session, Joel Mayer understood his anger, felt more compassion for himself, and had an action plan for how he  would talk to his family about how he was feeling and how he wanted things to change going forward.   Home Practice: daily journaling on prompts d/ in therapy  Elder Greening, PhD   ========================================  Wife: Pattie Borders Daughters:  [name] - Sophmore @ [employer] - 8th Grade @ [address]

## 2024-03-02 NOTE — Telephone Encounter (Signed)
 NPSG [employer] pending

## 2024-03-09 NOTE — Telephone Encounter (Signed)
 [employer] denied the NPSG see below for the reason.   HST [employer] no auth req.

## 2024-03-14 ENCOUNTER — Ambulatory Visit (INDEPENDENT_AMBULATORY_CARE_PROVIDER_SITE_OTHER): Payer: 59 | Admitting: Psychology

## 2024-03-14 DIAGNOSIS — F3341 Major depressive disorder, recurrent, in partial remission: Secondary | ICD-10-CM

## 2024-03-14 DIAGNOSIS — F411 Generalized anxiety disorder: Secondary | ICD-10-CM | POA: Diagnosis not present

## 2024-03-14 NOTE — Progress Notes (Signed)
 PROGRESS NOTE:  Name: Joel Mayer Date: 03/14/2024 MRN: [id number] DOB: 24-Jul-1976 PCP: Margarete Sharps, MD  Time spent: 8:00 AM - 8:58 AM   Annual Review: 05/10/2024   Today I met with  Joel Mayer in remote video (Caregility) face-to-face individual psychotherapy.   Distance Site: Client's Home Orginating Site: Dr Durand Gift Remote Office Consent: Obtained verbal consent to transmit session remotely. Patient is aware of the inherent limitations in participating in virtual therapy.      Presenting Problem: Joel Mayer states that in the Fall and winter he experiences a pattern of depression consistent with SAD.  While he feels that things are better, he would like some assistance and moving forward.  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  He has been prescribed Prozac 60 mg 17 years.  He was in therapy with Dr. Alyson Back until her retirement.  Dr. Alyson Back referred Pt to this therapist.  Pt is married to Joel Mayer (44) married since September of '07.  This is a first marriage for both of them. They have two daughters together. Pt reports periodic conflicts with wife and need to be more involved with his daughters.  Mental Status Exam:  Appearance: Casual    Behavior: Appropriate Motor: Normal Speech/Language: Normal Rate Affect: Appropriate Mood: normal Thought process: normal Thought content: WNL Sensory/Perceptual disturbances: WNL Orientation: oriented to person Attention: Good Concentration: Good Memory: Immediate;   Good Fund of knowledge: Good Insight: Good Judgment: Good Impulse Control: Good    Individualized Treatment Plan                Strengths: Intelligent, resourceful, loyal, good problem solver, persistent, deals with adversity, friendly, humorous  Supports: spouse, friends (mostly out of town), mother   Goal/Needs for Treatment:  In order of importance to patient 1) Learn and implement mindfulness techniques for the purposes of relapse prevention. 2) Increased  skills, effectiveness, and confidence in parenting 3) For interpersonal deficits, help the client develop new interpersonal skills and relationships.  4) Develop healthy interpersonal relationships that lead to the alleviation and help prevent  the relapse of depression.   Client Statement of Needs: Requires assistance of being more aware and mindful in his day to day life experience especially as his mood and behaviors impact his relationships with his wife and children.  A focus on relapse prevention related to depression and maintenance, .   Treatment Level: Biweekly Individual Outpatient Psychotherapy  Symptoms:  History of chronic or recurrent depression for which the client takes antidepressant medication, diminished interest in or enjoyment of activities, hypersensitivity to the criticism or disapproval of others, Reluctant involvement in social situations out of fear of saying or doing something foolish or of becoming emotional in front of others, reports difficulty in managing the challenging problem behavior of their child, social withdrawal. Displays deficits in parenting knowledge and skills and lacks knowledge regarding reasonable expectations for a child's behavior at a given developmental level.  Joel Mayer.  Client Treatment Preferences: continue treatment with current therapist   [name]:  Psychologist, Elder Greening, [name] will support the patient's ability to achieve the goals identified. Cognitive Behavioral Therapy, Dialectical Behavioral Therapy, Motivational Interviewing, behavior activation, parent training, and other evidenced-based practices will be used to promote progress towards healthy functioning.   [name] will: Actively participate in therapy, working towards healthy functioning.    *Justification for Continuation/Discontinuation of Goal: R=Revised, O=Ongoing, A=Achieved, D=Discontinued  Goal 1)  Learn and implement mindfulness techniques for the  purposes of relapse prevention.  5 Point Likert rating baseline date: 04/29/2021 Target Date Goal Was reviewed Status Code Progress towards goal/Likert rating  04/30/2023 04/28/2022          O 1/5 - pt has only a minimal level of the self awareness necessary to activate coping skills in a timely manner, unable to identify emotions  05/10/2024 05/11/2023          O 2/5 - pt demonstrates minimal progress, shows little self awareness necessary to activate coping skills in a timely manner, unable to identify emotions        Goal 2) Increased skills, effectiveness, and confidence in parenting  5 Point Likert rating baseline date: 04/29/2021 Target Date Goal Was reviewed Status Code Progress towards goal/Likert rating  04/30/2023 04/28/2022          O 2/5 - increased awareness of children's needs, increased level of attachment and engagement  05/10/2024 05/11/2023          O 4/5 - demonstrated the most progress in feeling more effective and engaged as a parent        Goal 3) For interpersonal deficits, help the client develop new interpersonal skills and relationships.   5 Point Likert rating baseline date: 04/29/2021 Target Date Goal Was reviewed Status Code Progress towards goal/Likert rating  04/30/2023 04/28/2022           O 1/5 - pt continues to avoid social opportunities, makes little effort to mobilize around goal to improve interpersonal interactions outside of immediate family  05/10/2024 05/11/2023           O 1/5 - pt made no progress, see need to focus on this area of his life this year        Goal 4) Develop healthy interpersonal relationships that lead to the alleviation and help prevent  the relapse of depression.    5 Point Likert rating baseline date: 04/29/2021 Target Date Goal Was reviewed Status Code Progress towards goal/Likert rating  04/30/2023 04/28/2022          O 1/5 - pt continues to avoid social opportunities, makes little effort to  mobilize around goal to improve interpersonal interactions outside of immediate family  05/10/2024 05/11/2023          O 1/5 - pt made no progress, see need to focus on this area of his life this year        This plan has been reviewed and created by the following participants:  This plan will be reviewed at least every 12 months. Date Behavioral Health Clinician Date Guardian/Patient   04/28/2022  Elder Greening, [name]   04/28/2022 Joel Mayer  05/11/2023 Elder Greening, [name]  05/11/2023 Joel Mayer               Diagnosis:  Generalized Anxiety Disorder Major Depressive Disorder, recurrent, in partial remission   Joel Mayer reports that it was a hectic weekend at home with his older daughter, while his younger daughter and wife were away.  He states that he was easily irritated by his wife, and he was attributing, or at least wondering if he was "still being angry with her."  I made a number of inquires to probe if there might be some other underlying issues.  We were then able to d/e/p some growing anxieties about the current political climate, financial uncertainties at work, in addition to [employer] issues.  I encouraged him to consider something other than an all-or-nothing  approach to coping which would allow for a more mindful and holistic approach which feel more genuine and is less dissociated or cynical.  Joel Mayer could see the wisdom/benefits to this approach and responded positively to this suggestion.    Home Practice: daily journaling on prompts d/ in therapy  Elder Greening, PhD   ========================================  Wife: Pattie Borders Daughters:  [name] - [place] @ [employer] - 8th Grade @ [address]

## 2024-03-28 ENCOUNTER — Ambulatory Visit: Payer: 59 | Admitting: Psychology

## 2024-03-28 DIAGNOSIS — F3341 Major depressive disorder, recurrent, in partial remission: Secondary | ICD-10-CM | POA: Diagnosis not present

## 2024-03-28 DIAGNOSIS — F411 Generalized anxiety disorder: Secondary | ICD-10-CM | POA: Diagnosis not present

## 2024-03-28 NOTE — Progress Notes (Signed)
**Note De-Mayer via Obfuscation**  PROGRESS NOTE:  Name: Joel Mayer Date: 03/28/2024 MRN: [id number] DOB: Mar 02, 1976 PCP: Joel Sharps, MD  Time spent: 8:00 AM - 8:59 AM   Annual Review: 05/10/2024   Today I met with  Joel Mayer in remote video (Caregility) face-to-face individual psychotherapy.   Distance Site: Client's Home Orginating Site: Joel Mayer Remote Office Consent: Obtained verbal consent to transmit session remotely. Patient is aware of the inherent limitations in participating in virtual therapy.      Presenting Problem: Joel Mayer states that in the Fall and winter he experiences a pattern of depression consistent with SAD.  While he feels that things are better, he would like some assistance and moving forward.  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  He has been prescribed Prozac 60 mg 17 years.  He was in therapy with Joel Mayer until her retirement.  Joel Mayer referred Pt to this therapist.  Pt is married to Joel Mayer (44) married since September of '07.  This is a first marriage for both of them. They have two daughters together. Pt reports periodic conflicts with wife and need to be more involved with his daughters.  Mental Status Exam:  Appearance: Casual    Behavior: Appropriate Motor: Normal Speech/Language: Normal Rate Affect: Appropriate Mood: normal Thought process: normal Thought content: WNL Sensory/Perceptual disturbances: WNL Orientation: oriented to person Attention: Good Concentration: Good Memory: Immediate;   Good Fund of knowledge: Good Insight: Good Judgment: Good Impulse Control: Good    Individualized Treatment Plan                Strengths: Intelligent, resourceful, loyal, good problem solver, persistent, deals with adversity, friendly, humorous  Supports: spouse, friends (mostly out of town), mother   Goal/Needs for Treatment:  In order of importance to patient 1) Learn and implement mindfulness techniques for the purposes of relapse prevention. 2) Increased  skills, effectiveness, and confidence in parenting 3) For interpersonal deficits, help the client develop new interpersonal skills and relationships.  4) Develop healthy interpersonal relationships that lead to the alleviation and help prevent  the relapse of depression.   Client Statement of Needs: Requires assistance of being more aware and mindful in his day to day life experience especially as his mood and behaviors impact his relationships with his wife and children.  A focus on relapse prevention related to depression and maintenance, .   Treatment Level: Biweekly Individual Outpatient Psychotherapy  Symptoms:  History of chronic or recurrent depression for which the client takes antidepressant medication, diminished interest in or enjoyment of activities, hypersensitivity to the criticism or disapproval of others, Reluctant involvement in social situations out of fear of saying or doing something foolish or of becoming emotional in front of others, reports difficulty in managing the challenging problem behavior of their child, social withdrawal. Displays deficits in parenting knowledge and skills and lacks knowledge regarding reasonable expectations for a child's behavior at a given developmental level.  Joel Mayer.  Client Treatment Preferences: continue treatment with current therapist   [name]:  Joel Mayer, Joel Mayer, Joel Mayer. Cognitive Behavioral Therapy, Dialectical Behavioral Therapy, Motivational Interviewing, behavior activation, parent training, and other evidenced-based practices will be used to promote progress towards healthy functioning.   [name] will: Actively participate in therapy, working towards healthy functioning.    *Justification for Continuation/Discontinuation of Goal: R=Revised, O=Ongoing, A=Achieved, D=Discontinued  Goal 1)  Learn and implement mindfulness techniques for  the purposes of relapse prevention.  5 Point Likert rating baseline date: 04/29/2021 Target Date Goal Was reviewed Status Code Progress towards goal/Likert rating  04/30/2023 04/28/2022          O 1/5 - pt has only a minimal level of the self awareness necessary to activate coping skills in a timely manner, unable to identify emotions  05/10/2024 05/11/2023          O 2/5 - pt demonstrates minimal progress, shows little self awareness necessary to activate coping skills in a timely manner, unable to identify emotions        Goal 2) Increased skills, effectiveness, and confidence in parenting  5 Point Likert rating baseline date: 04/29/2021 Target Date Goal Was reviewed Status Code Progress towards goal/Likert rating  04/30/2023 04/28/2022          O 2/5 - increased awareness of children's needs, increased level of attachment and engagement  05/10/2024 05/11/2023          O 4/5 - demonstrated the most progress in feeling more effective and engaged as a parent        Goal 3) For interpersonal deficits, help the client develop new interpersonal skills and relationships.   5 Point Likert rating baseline date: 04/29/2021 Target Date Goal Was reviewed Status Code Progress towards goal/Likert rating  04/30/2023 04/28/2022           O 1/5 - pt continues to avoid social opportunities, makes little effort to mobilize around goal to improve interpersonal interactions outside of immediate family  05/10/2024 05/11/2023           O 1/5 - pt made no progress, see need to focus on this area of his life this year        Goal 4) Develop healthy interpersonal relationships that lead to the alleviation and help prevent  the relapse of depression.    5 Point Likert rating baseline date: 04/29/2021 Target Date Goal Was reviewed Status Code Progress towards goal/Likert rating  04/30/2023 04/28/2022          O 1/5 - pt continues to avoid social opportunities, makes little effort to  mobilize around goal to improve interpersonal interactions outside of immediate family  05/10/2024 05/11/2023          O 1/5 - pt made no progress, see need to focus on this area of his life this year        This plan has been reviewed and created by the following participants:  This plan will be reviewed at least every 12 months. Date Behavioral Health Clinician Date Guardian/Patient   04/28/2022  Joel Mayer, [name]   04/28/2022 Joel Mayer  05/11/2023 Joel Mayer, [name]  05/11/2023 Joel Mayer               Diagnosis:  Generalized Anxiety Disorder Major Depressive Disorder, recurrent, in partial remission   Joel Mayer reports that it was a hectic weekend with his girls and other social activities.  There was another situation related to their family fiances which set off his anger again.  We d/e/p his discovery, how he felt, and steps forward.  I provided support and guidance around how to approach the subject productively and offered practical suggestions for how to avoid the same issues in the future.   Home Practice: daily journaling on prompts d/ in therapy  Joel Greening, PhD   ========================================  Wife: Pattie Borders Daughters:  [name] - [employer] @ [employer]  Polly Brink - 8th Grade @ [address]

## 2024-04-11 ENCOUNTER — Ambulatory Visit (INDEPENDENT_AMBULATORY_CARE_PROVIDER_SITE_OTHER): Admitting: Psychology

## 2024-04-11 DIAGNOSIS — F3341 Major depressive disorder, recurrent, in partial remission: Secondary | ICD-10-CM | POA: Diagnosis not present

## 2024-04-11 DIAGNOSIS — F411 Generalized anxiety disorder: Secondary | ICD-10-CM

## 2024-04-11 NOTE — Progress Notes (Signed)
 PROGRESS NOTE:  Name: Joel Mayer Date: 04/11/2024 MRN: [id number] DOB: 04/22/1976 PCP: Joel Sharps, MD  Time spent: 8:00 AM - 8:58 AM   Annual Review: 05/10/2024   Today I met with  Joel Mayer in remote video (Caregility) face-to-face individual psychotherapy.   Distance Site: Client's Home Orginating Site: Joel Mayer Remote Office Consent: Obtained verbal consent to transmit session remotely. Patient is aware of the inherent limitations in participating in virtual therapy.      Presenting Problem: Joel Mayer states that in the Fall and winter he experiences a pattern of depression consistent with SAD.  While he feels that things are better, he would like some assistance and moving forward.  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  He has been prescribed Prozac 60 mg 17 years.  He was in therapy with Joel Mayer until her retirement.  Joel Mayer referred Pt to this therapist.  Pt is married to Joel Mayer (44) married since September of '07.  This is a first marriage for both of them. They have two daughters together. Pt reports periodic conflicts with wife and need to be more involved with his daughters.  Mental Status Exam:  Appearance: Casual    Behavior: Appropriate Motor: Normal Speech/Language: Normal Rate Affect: Appropriate Mood: normal Thought process: normal Thought content: WNL Sensory/Perceptual disturbances: WNL Orientation: oriented to person Attention: Good Concentration: Good Memory: Immediate;   Good Fund of knowledge: Good Insight: Good Judgment: Good Impulse Control: Good    Individualized Treatment Plan                Strengths: Intelligent, resourceful, loyal, good problem solver, persistent, deals with adversity, friendly, humorous  Supports: spouse, friends (mostly out of town), mother   Goal/Needs for Treatment:  In order of importance to patient 1) Learn and implement mindfulness techniques for the purposes of relapse prevention. 2) Increased  skills, effectiveness, and confidence in parenting 3) For interpersonal deficits, help the client develop new interpersonal skills and relationships.  4) Develop healthy interpersonal relationships that lead to the alleviation and help prevent  the relapse of depression.   Client Statement of Needs: Requires assistance of being more aware and mindful in his day to day life experience especially as his mood and behaviors impact his relationships with his wife and children.  A focus on relapse prevention related to depression and maintenance, .   Treatment Level: Biweekly Individual Outpatient Psychotherapy  Symptoms:  History of chronic or recurrent depression for which the client takes antidepressant medication, diminished interest in or enjoyment of activities, hypersensitivity to the criticism or disapproval of others, Reluctant involvement in social situations out of fear of saying or doing something foolish or of becoming emotional in front of others, reports difficulty in managing the challenging problem behavior of their child, social withdrawal. Displays deficits in parenting knowledge and skills and lacks knowledge regarding reasonable expectations for a child's behavior at a given developmental level.  Joel Mayer.  Client Treatment Preferences: continue treatment with current therapist   [name]:  Psychologist, Joel Mayer, [name] will support the patient's ability to achieve the goals identified. Cognitive Behavioral Therapy, Dialectical Behavioral Therapy, Motivational Interviewing, behavior activation, parent training, and other evidenced-based practices will be used to promote progress towards healthy functioning.   [name] will: Actively participate in therapy, working towards healthy functioning.    *Justification for Continuation/Discontinuation of Goal: R=Revised, O=Ongoing, A=Achieved, D=Discontinued  Goal 1)  Learn and implement mindfulness techniques for  the purposes of relapse prevention.  5 Point Likert rating baseline date: 04/29/2021 Target Date Goal Was reviewed Status Code Progress towards goal/Likert rating  04/30/2023 04/28/2022          O 1/5 - pt has only a minimal level of the self awareness necessary to activate coping skills in a timely manner, unable to identify emotions  05/10/2024 05/11/2023          O 2/5 - pt demonstrates minimal progress, shows little self awareness necessary to activate coping skills in a timely manner, unable to identify emotions        Goal 2) Increased skills, effectiveness, and confidence in parenting  5 Point Likert rating baseline date: 04/29/2021 Target Date Goal Was reviewed Status Code Progress towards goal/Likert rating  04/30/2023 04/28/2022          O 2/5 - increased awareness of children's needs, increased level of attachment and engagement  05/10/2024 05/11/2023          O 4/5 - demonstrated the most progress in feeling more effective and engaged as a parent        Goal 3) For interpersonal deficits, help the client develop new interpersonal skills and relationships.   5 Point Likert rating baseline date: 04/29/2021 Target Date Goal Was reviewed Status Code Progress towards goal/Likert rating  04/30/2023 04/28/2022           O 1/5 - pt continues to avoid social opportunities, makes little effort to mobilize around goal to improve interpersonal interactions outside of immediate family  05/10/2024 05/11/2023           O 1/5 - pt made no progress, see need to focus on this area of his life this year        Goal 4) Develop healthy interpersonal relationships that lead to the alleviation and help prevent  the relapse of depression.    5 Point Likert rating baseline date: 04/29/2021 Target Date Goal Was reviewed Status Code Progress towards goal/Likert rating  04/30/2023 04/28/2022          O 1/5 - pt continues to avoid social opportunities, makes little effort to  mobilize around goal to improve interpersonal interactions outside of immediate family  05/10/2024 05/11/2023          O 1/5 - pt made no progress, see need to focus on this area of his life this year        This plan has been reviewed and created by the following participants:  This plan will be reviewed at least every 12 months. Date Behavioral Health Clinician Date Guardian/Patient   04/28/2022  Joel Mayer, [name]   04/28/2022 Kodee "Joel Mayer" [place]  05/11/2023 Joel Mayer, [name]  05/11/2023 Autry Legions "Joel Mayer" [place]               Diagnosis:  Generalized Anxiety Disorder Major Depressive Disorder, recurrent, in partial remission   Joel Mayer reports that he has been watching their family spending over the last few months, and it seems to be trending in the right direction.  We d/e/p adjusting his thinking to focusing on the positive (gratitude), managing all-or-nothing thinking, and connection between negative thinking patterns and depression.  We d/e/p his mother's thought patterns and made connections between the past and the present.  Lastly, we d/ trouble with initiating, low motivation, and (dopamine) strategies to help with initiation.   Home Practice: daily journaling on prompts d/ in therapy  Joel Greening, PhD   ========================================  Wife: Research scientist (physical sciences) Daughters:  [name] - [employer] @ [employer] - 8th Grade @ [address]

## 2024-04-25 ENCOUNTER — Ambulatory Visit (INDEPENDENT_AMBULATORY_CARE_PROVIDER_SITE_OTHER): Admitting: Psychology

## 2024-04-25 DIAGNOSIS — F3341 Major depressive disorder, recurrent, in partial remission: Secondary | ICD-10-CM

## 2024-04-25 DIAGNOSIS — F320 Major depressive disorder, single episode, mild: Secondary | ICD-10-CM

## 2024-04-25 DIAGNOSIS — F411 Generalized anxiety disorder: Secondary | ICD-10-CM

## 2024-04-25 NOTE — Progress Notes (Signed)
 PROGRESS NOTE: Annual Review  Name: Joel Mayer Date: 04/25/2024 MRN: [id number] DOB: [birth year] PCP: Margarete Sharps, MD  Time spent: 8:00 AM - 8:57 AM   Annual Review: 04/25/2025   Today I met with  Joel Mayer in remote video (Caregility) face-to-face individual psychotherapy.   Distance Site: Client's Home Orginating Site: Dr Durand Gift Remote Office Consent: Obtained verbal consent to transmit session remotely. Patient is aware of the inherent limitations in participating in virtual therapy.      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  He states that in the Fall and winter he experiences a pattern of depression consistent with SAD.  While he feels that things are better, he would like some assistance and moving forward.  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  He has been prescribed Prozac 60 mg 17 years.  He was in therapy with Dr. Alyson Back until her retirement.  Dr. Alyson Back referred Pt to this therapist.    Background History: Pt is married to [place] (48) married since September of '07.  This is a first marriage for both of them. They have two daughters together. Joel Mayer is a Holiday representative in high school and Polly Brink is a Chief Operating Officer.  Pt reports periodic conflicts with wife and need to be more involved with his daughters.  Father is now deceased and he has an aging mother with whom he has a conflictual relationship.  Father had a long history of depression which went untreated most of his life.  His death was sudden and unexpected.  This year was a difficult one as he struggled to grieve, review their history together and changing dynamics with his mother as a result of this lose.  Mental Status Exam:  Appearance: Casual    Behavior: Appropriate Motor: Normal Speech/Language: Normal Rate Affect: Appropriate Mood: normal Thought process: normal Thought content: WNL Sensory/Perceptual disturbances: WNL Orientation: oriented to  person Attention: Good Concentration: Good Memory: Immediate;   Good Fund of knowledge: Good Insight: Good Judgment: Good Impulse Control: Good   Individualized Treatment Plan                Strengths: Intelligent, resourceful, loyal, good problem solver, persistent, deals with adversity, friendly, humorous  Supports: spouse, friends (mostly out of town), mother   Goal/Needs for Treatment:  In order of importance to patient 1) Learn and implement mindfulness techniques for the purposes of relapse prevention. 2) Increased skills, effectiveness, and confidence in parenting 3) For interpersonal deficits, help the client develop new interpersonal skills and relationships.  4) Develop healthy interpersonal relationships that lead to the alleviation and help prevent  the relapse of depression.   Client Statement of Needs: Requires assistance of being more aware and mindful in his day to day life experience especially as his mood and behaviors impact his relationships with his wife and children.  A focus on relapse prevention related to depression and maintenance, .   Treatment Level: Biweekly Individual Outpatient Psychotherapy  Symptoms:  History of chronic or recurrent depression for which the client takes antidepressant medication, diminished interest in or enjoyment of activities, hypersensitivity to the criticism or disapproval of others, Reluctant involvement in social situations out of fear of saying or doing something foolish or of becoming emotional in front of others, reports difficulty in managing the challenging problem behavior of their child, social withdrawal. Displays deficits in parenting knowledge and skills and lacks knowledge regarding reasonable expectations for a child's  behavior at a given developmental level.  [name].  Client Treatment Preferences: continue treatment with current therapist   [name]:  Psychologist, Elder Greening, [name] will support the patient's ability to achieve the goals identified. Cognitive Behavioral Therapy, Dialectical Behavioral Therapy, Motivational Interviewing, behavior activation, parent training, and other evidenced-based practices will be used to promote progress towards healthy functioning.   [name] will: Actively participate in therapy, working towards healthy functioning.    *Justification for Continuation/Discontinuation of Goal: R=Revised, O=Ongoing, A=Achieved, D=Discontinued  Goal 1) Learn and implement mindfulness techniques for the purposes of relapse prevention.  5 Point Likert rating baseline date: 04/29/2021 Target Date Goal Was reviewed Status Code Progress towards goal/Likert rating  04/30/2023 04/28/2022          O 1/5 - pt has only a minimal level of the self awareness necessary to activate coping skills in a timely manner, unable to identify emotions  05/10/2024 05/11/2023          O 2/5 - pt demonstrates minimal progress, shows little self awareness necessary to activate coping skills in a timely manner, unable to identify emotions  04/25/2025 04/25/2024          O 2.5/5 - pt demonstrates minimal progress, minor growth in self awareness necessary to activate coping skills in a timely manner, unable to identify emotions   Goal 2) Increased skills, effectiveness, and confidence in parenting  5 Point Likert rating baseline date: 04/29/2021 Target Date Goal Was reviewed Status Code Progress towards goal/Likert rating  04/30/2023 04/28/2022          O 2/5 - increased awareness of children's needs, increased level of attachment and engagement  05/10/2024 05/11/2023          O 4/5 - demonstrated the most progress in feeling more effective and engaged as a parent  04/25/2025 04/25/2024          O 4.5/5 - increased awareness of the needs of adolescent girls, continues to increase level of attachment and engagement   Goal 3) For interpersonal deficits, help  the client develop new interpersonal skills, decrease social anxiety, and relationships.   5 Point Likert rating baseline date: 04/29/2021 Target Date Goal Was reviewed Status Code Progress towards goal/Likert rating   04/30/2023  04/28/2022           O  1/5 - pt continues to avoid social opportunities, makes little effort to mobilize around goal to improve interpersonal interactions outside of immediate family   05/10/2024  05/11/2023           O  1/5 - pt made no progress, see need to focus on this area of his life this year   04/25/2024  04/25/2024           O  1.5/5 - pt made the least amount of progress in this area, and admits he put little effort here and is trying focus more on social relationship this year   Goal 4) Develop healthy interpersonal relationships that lead to the alleviation and help prevent  the relapse of depression.    5 Point Likert rating baseline date: 04/29/2021 Target Date Goal Was reviewed Status Code Progress towards goal/Likert rating  04/30/2023 04/28/2022          O 1/5 - pt continues to avoid social opportunities, makes little effort to mobilize around goal to improve interpersonal interactions outside of immediate family  05/10/2024 05/11/2023  O 1/5 - pt made no progress, see need to focus on this area of his life this year  04/25/2025 04/25/2024          O 1.5/5 - pt made the least amount of progress in this area, and admits he put little effort here and is trying focus more on social relationship this year   Goal 4) Alleviate work dissatisfaction which is a significant source of stress  5 Point Likert rating baseline date: 04/25/2024 Target Date Goal Was reviewed Status Code Progress towards goal/Likert rating  04/25/2025 04/25/2024          N                       Goal 5) Phase of Life change (only adult child of an aging parent)  5 Point Likert rating baseline date: 04/25/2024 Target Date Goal Was reviewed Status Code Progress towards goal/Likert rating   04/25/2025 04/25/2024          N                        This plan has been reviewed and created by the following participants:  This plan will be reviewed at least every 12 months. Date Behavioral Health Clinician Date Guardian/Patient   04/28/2022  Elder Greening, [name]   04/28/2022 Joel Mayer  05/11/2023 Elder Greening, [name]  05/11/2023 Joel Mayer  04/25/2024 Elder Greening, [name]  04/25/2024 Joel Mayer          Diagnosis:  Generalized Anxiety Disorder Major Depressive Disorder, recurrent, in partial remission   In session today, conducted pt's annual review.  We reviewed Joel Mayer's progress, d/ goals and updated her treatment plan.  Joel Mayer actively participated in the creation of her treatment plan and freely gave his consent.  Pt was told to expect an electronic document indicating that we reviewed his progress in therapy, updated his goals, set new goals as was appropriate to his needs which required his signature.  Joel Mayer reports that he took his very anxious daughter to summer camp.  We d/e/p what occurred, how he managed her anxiety, how he managed his own feelings, and how things resolved.   We also spent some time d/ how he is realizing more and more that he is wasting his time at work.  We agreed that he wants to focus on this area in his life this year.  Home Practice: daily journaling on prompts d/ in therapy  Elder Greening, PhD   ========================================  Wife: Joel Mayer Daughters:  Joel Mayer - Soph[employer] @ [employer] Joel Mayer - 8th Grade @ [address]

## 2024-05-09 ENCOUNTER — Ambulatory Visit: Admitting: Psychology

## 2024-05-23 ENCOUNTER — Ambulatory Visit: Admitting: Psychology

## 2024-06-06 ENCOUNTER — Ambulatory Visit (INDEPENDENT_AMBULATORY_CARE_PROVIDER_SITE_OTHER): Admitting: Psychology

## 2024-06-06 DIAGNOSIS — F3341 Major depressive disorder, recurrent, in partial remission: Secondary | ICD-10-CM | POA: Diagnosis not present

## 2024-06-06 DIAGNOSIS — F411 Generalized anxiety disorder: Secondary | ICD-10-CM

## 2024-06-06 NOTE — Progress Notes (Signed)
 PROGRESS NOTE:   Name: Joel Mayer Date: 06/06/2024 MRN: [id number] DOB: 29-Jul-1976 PCP: Joel Norleen, MD  Time spent: 8:00 AM - 8:57 AM   Annual Review: 04/25/2025   Today I met with  Joel Mayer in remote video (Caregility) face-to-face individual psychotherapy.   Distance Site: Client's Home Orginating Site: Dr Joel Mayer Remote Office Consent: Obtained verbal consent to transmit session remotely. Patient is aware of the inherent limitations in participating in virtual therapy.      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  He states that in the Fall and winter he experiences a pattern of depression consistent with SAD.  While he feels that things are better, he would like some assistance and moving forward.  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  He has been prescribed Prozac 60 mg 17 years.  He was in therapy with Joel Mayer until her retirement.  Joel Mayer referred Pt to this therapist.    Background History: Pt is married to Joel Mayer (48) married since September of '07.  This is a first marriage for both of them. They have two daughters together. 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  Pt reports periodic conflicts with wife and need to be more involved with his daughters.  Father is now deceased and he has an aging mother with whom he has a conflictual relationship.  Father had a long history of depression which went untreated most of his life.  His death was sudden and unexpected.  This year was a difficult one as he struggled to grieve, review their history together and changing dynamics with his mother as a result of this lose.  Mental Status Exam:  Appearance: Casual    Behavior: Appropriate Motor: Normal Speech/Language: Normal Rate Affect: Appropriate Mood: normal Thought process: normal Thought content: WNL Sensory/Perceptual disturbances: WNL Orientation: oriented to person Attention:  Good Concentration: Good Memory: Immediate;   Good Fund of knowledge: Good Insight: Good Judgment: Good Impulse Control: Good   Individualized Treatment Plan                Strengths: Intelligent, resourceful, loyal, good problem solver, persistent, deals with adversity, friendly, humorous  Supports: spouse, friends (mostly out of town), mother   Goal/Needs for Treatment:  In order of importance to patient 1) Learn and implement mindfulness techniques for the purposes of relapse prevention. 2) Increased skills, effectiveness, and confidence in parenting 3) For interpersonal deficits, help the client develop new interpersonal skills and relationships.  4) Develop healthy interpersonal relationships that lead to the alleviation and help prevent  the relapse of depression.   Client Statement of Needs: Requires assistance of being more aware and mindful in his day to day life experience especially as his mood and behaviors impact his relationships with his wife and children.  A focus on relapse prevention related to depression and maintenance, .   Treatment Level: Biweekly Individual Outpatient Psychotherapy  Symptoms:  History of chronic or recurrent depression for which the client takes antidepressant medication, diminished interest in or enjoyment of activities, hypersensitivity to the criticism or disapproval of others, Reluctant involvement in social situations out of fear of saying or doing something foolish or of becoming emotional in front of others, reports difficulty in managing the challenging problem behavior of their child, social withdrawal. Displays deficits in parenting knowledge and skills and lacks knowledge regarding reasonable expectations for a child's behavior  at a given developmental level.  Joel Mayer.  Client Treatment Preferences: continue treatment with current therapist   [name]:  Psychologist, Joel Mayer, [name] will  support the patient's ability to achieve the goals identified. Cognitive Behavioral Therapy, Dialectical Behavioral Therapy, Motivational Interviewing, behavior activation, parent training, and other evidenced-based practices will be used to promote progress towards healthy functioning.   [name] will: Actively participate in therapy, working towards healthy functioning.    *Justification for Continuation/Discontinuation of Goal: R=Revised, O=Ongoing, A=Achieved, D=Discontinued  Goal 1) Learn and implement mindfulness techniques for the purposes of relapse prevention.  5 Point Likert rating baseline date: 04/29/2021 Target Date Goal Was reviewed Status Code Progress towards goal/Likert rating  04/30/2023 04/28/2022          O 1/5 - pt has only a minimal level of the self awareness necessary to activate coping skills in a timely manner, unable to identify emotions  05/10/2024 05/11/2023          O 2/5 - pt demonstrates minimal progress, shows little self awareness necessary to activate coping skills in a timely manner, unable to identify emotions  04/25/2025 04/25/2024          O 2.5/5 - pt demonstrates minimal progress, minor growth in self awareness necessary to activate coping skills in a timely manner, unable to identify emotions   Goal 2) Increased skills, effectiveness, and confidence in parenting  5 Point Likert rating baseline date: 04/29/2021 Target Date Goal Was reviewed Status Code Progress towards goal/Likert rating  04/30/2023 04/28/2022          O 2/5 - increased awareness of children's needs, increased level of attachment and engagement  05/10/2024 05/11/2023          O 4/5 - demonstrated the most progress in feeling more effective and engaged as a parent  04/25/2025 04/25/2024          O 4.5/5 - increased awareness of the needs of adolescent girls, continues to increase level of attachment and engagement   Goal 3) For interpersonal deficits, help the client develop  new interpersonal skills, decrease social anxiety, and relationships.   5 Point Likert rating baseline date: 04/29/2021 Target Date Goal Was reviewed Status Code Progress towards goal/Likert rating   04/30/2023  04/28/2022           O  1/5 - pt continues to avoid social opportunities, makes little effort to mobilize around goal to improve interpersonal interactions outside of immediate family   05/10/2024  05/11/2023           O  1/5 - pt made no progress, see need to focus on this area of his life this year   04/25/2024  04/25/2024           O  1.5/5 - pt made the least amount of progress in this area, and admits he put little effort here and is trying focus more on social relationship this year   Goal 4) Develop healthy interpersonal relationships that lead to the alleviation and help prevent  the relapse of depression.    5 Point Likert rating baseline date: 04/29/2021 Target Date Goal Was reviewed Status Code Progress towards goal/Likert rating  04/30/2023 04/28/2022          O 1/5 - pt continues to avoid social opportunities, makes little effort to mobilize around goal to improve interpersonal interactions outside of immediate family  05/10/2024 05/11/2023  O 1/5 - pt made no progress, see need to focus on this area of his life this year  04/25/2025 04/25/2024          O 1.5/5 - pt made the least amount of progress in this area, and admits he put little effort here and is trying focus more on social relationship this year   Goal 4) Alleviate work dissatisfaction which is a significant source of stress  5 Point Likert rating baseline date: 04/25/2024 Target Date Goal Was reviewed Status Code Progress towards goal/Likert rating  04/25/2025 04/25/2024          N                       Goal 5) Phase of Life change (only adult child of an aging parent)  5 Point Likert rating baseline date: 04/25/2024 Target Date Goal Was reviewed Status Code Progress towards goal/Likert rating  04/25/2025 04/25/2024           N                        This plan has been reviewed and created by the following participants:  This plan will be reviewed at least every 12 months. Date Behavioral Health Clinician Date Guardian/Patient   04/28/2022  Joel Mayer, [name]   04/28/2022 Joel Mayer  05/11/2023 Joel Mayer, [name]  05/11/2023 Joel Mayer  04/25/2024 Joel Mayer, [name]  04/25/2024 Joel Mayer          Diagnosis:  Generalized Anxiety Disorder Major Depressive Disorder, recurrent, in partial remission   Joel Mayer reports that his daughter returned from to summer camp where she was training to become a CET.  It was very stressful and resembled pledge hazing.  We d/e/p what occurred, how he managed her anxiety while she was away, how he managed his own feelings of anxiety, and how things resolved.  We agreed that he felt he was wasting his time at work and wanted to focus on this area in his life this year.  In session, we d/e/p his f/u on a possible job lead, some thoughts he was having about on personal growth and some possible avenues to pursue.  I provided some additional ideas to look into, support, encouragement and the guidance he needed.   Home Practice: daily journaling on prompts d/ in therapy  Joel Jenkins Sprung, PhD   ========================================  Wife: Powell Daughters:  Joel Mayer - [employer] @ [employer] - 8th Grade @ [address]         Joel Jenkins Sprung, PhD

## 2024-06-20 ENCOUNTER — Ambulatory Visit: Admitting: Psychology

## 2024-06-20 DIAGNOSIS — F411 Generalized anxiety disorder: Secondary | ICD-10-CM

## 2024-06-20 DIAGNOSIS — F3341 Major depressive disorder, recurrent, in partial remission: Secondary | ICD-10-CM | POA: Diagnosis not present

## 2024-06-20 NOTE — Progress Notes (Signed)
 PROGRESS NOTE:   Name: Joel Mayer Date: 06/20/2024 MRN: [id number] DOB: [birth year] PCP: Onita Norleen, MD  Time spent: 8:00 AM - 8:57 AM   Annual Review: 04/25/2025   Today I met with  Joel Mayer in remote video (Caregility) face-to-face individual psychotherapy.   Distance Site: Client's Home Orginating Site: Dr Edison Remote Office Consent: Obtained verbal consent to transmit session remotely. Patient is aware of the inherent limitations in participating in virtual therapy.      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  He states that in the Fall and winter he experiences a pattern of depression consistent with SAD.  While he feels that things are better, he would like some assistance and moving forward.  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  He has been prescribed Prozac 60 mg 17 years.  He was in therapy with Dr. Arzella until her retirement.  Dr. Arzella referred Pt to this therapist.    Background History: Pt is married to Joel Mayer (48) married since September of '07.  This is a first marriage for both of them. They have two daughters together. 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  Pt reports periodic conflicts with wife and need to be more involved with his daughters.  Father is now deceased and he has an aging mother with whom he has a conflictual relationship.  Father had a long history of depression which went untreated most of his life.  His death was sudden and unexpected.  This year was a difficult one as he struggled to grieve, review their history together and changing dynamics with his mother as a result of this lose.  Mental Status Exam:  Appearance: Casual    Behavior: Appropriate Motor: Normal Speech/Language: Normal Rate Affect: Appropriate Mood: normal Thought process: normal Thought content: WNL Sensory/Perceptual disturbances: WNL Orientation: oriented to person Attention:  Good Concentration: Good Memory: Immediate;   Good Fund of knowledge: Good Insight: Good Judgment: Good Impulse Control: Good   Individualized Treatment Plan                Strengths: Intelligent, resourceful, loyal, good problem solver, persistent, deals with adversity, friendly, humorous  Supports: spouse, friends (mostly out of town), mother   Goal/Needs for Treatment:  In order of importance to patient 1) Learn and implement mindfulness techniques for the purposes of relapse prevention. 2) Increased skills, effectiveness, and confidence in parenting 3) For interpersonal deficits, help the client develop new interpersonal skills and relationships.  4) Develop healthy interpersonal relationships that lead to the alleviation and help prevent  the relapse of depression.   Client Statement of Needs: Requires assistance of being more aware and mindful in his day to day life experience especially as his mood and behaviors impact his relationships with his wife and children.  A focus on relapse prevention related to depression and maintenance, .   Treatment Level: Biweekly Individual Outpatient Psychotherapy  Symptoms:  History of chronic or recurrent depression for which the client takes antidepressant medication, diminished interest in or enjoyment of activities, hypersensitivity to the criticism or disapproval of others, Reluctant involvement in social situations out of fear of saying or doing something foolish or of becoming emotional in front of others, reports difficulty in managing the challenging problem behavior of their child, social withdrawal. Displays deficits in parenting knowledge and skills and lacks knowledge regarding reasonable expectations for a child's behavior  at a given developmental level.  [name].  Client Treatment Preferences: continue treatment with current therapist   [name]:  Psychologist, Joel Mayer, [name] will  support the patient's ability to achieve the goals identified. Cognitive Behavioral Therapy, Dialectical Behavioral Therapy, Motivational Interviewing, behavior activation, parent training, and other evidenced-based practices will be used to promote progress towards healthy functioning.   [name] will: Actively participate in therapy, working towards healthy functioning.    *Justification for Continuation/Discontinuation of Goal: R=Revised, O=Ongoing, A=Achieved, D=Discontinued  Goal 1) Learn and implement mindfulness techniques for the purposes of relapse prevention.  5 Point Likert rating baseline date: 04/29/2021 Target Date Goal Was reviewed Status Code Progress towards goal/Likert rating  04/30/2023 04/28/2022          O 1/5 - pt has only a minimal level of the self awareness necessary to activate coping skills in a timely manner, unable to identify emotions  05/10/2024 05/11/2023          O 2/5 - pt demonstrates minimal progress, shows little self awareness necessary to activate coping skills in a timely manner, unable to identify emotions  04/25/2025 04/25/2024          O 2.5/5 - pt demonstrates minimal progress, minor growth in self awareness necessary to activate coping skills in a timely manner, unable to identify emotions   Goal 2) Increased skills, effectiveness, and confidence in parenting  5 Point Likert rating baseline date: 04/29/2021 Target Date Goal Was reviewed Status Code Progress towards goal/Likert rating  04/30/2023 04/28/2022          O 2/5 - increased awareness of children's needs, increased level of attachment and engagement  05/10/2024 05/11/2023          O 4/5 - demonstrated the most progress in feeling more effective and engaged as a parent  04/25/2025 04/25/2024          O 4.5/5 - increased awareness of the needs of adolescent girls, continues to increase level of attachment and engagement   Goal 3) For interpersonal deficits, help the client develop  new interpersonal skills, decrease social anxiety, and relationships.   5 Point Likert rating baseline date: 04/29/2021 Target Date Goal Was reviewed Status Code Progress towards goal/Likert rating   04/30/2023  04/28/2022           O  1/5 - pt continues to avoid social opportunities, makes little effort to mobilize around goal to improve interpersonal interactions outside of immediate family   05/10/2024  05/11/2023           O  1/5 - pt made no progress, see need to focus on this area of his life this year   04/25/2025  04/25/2024           O  1.5/5 - pt made the least amount of progress in this area, and admits he put little effort here and is trying focus more on social relationship this year   Goal 4) Develop healthy interpersonal relationships that lead to the alleviation and help prevent  the relapse of depression.    5 Point Likert rating baseline date: 04/29/2021 Target Date Goal Was reviewed Status Code Progress towards goal/Likert rating  04/30/2023 04/28/2022          O 1/5 - pt continues to avoid social opportunities, makes little effort to mobilize around goal to improve interpersonal interactions outside of immediate family  05/10/2024 05/11/2023  O 1/5 - pt made no progress, see need to focus on this area of his life this year  04/25/2025 04/25/2024          O 1.5/5 - pt made the least amount of progress in this area, and admits he put little effort here and is trying focus more on social relationship this year   Goal 4) Alleviate work dissatisfaction which is a significant source of stress  5 Point Likert rating baseline date: 04/25/2024 Target Date Goal Was reviewed Status Code Progress towards goal/Likert rating  04/25/2025 04/25/2024          N                       Goal 5) Phase of Life change (only adult child of an aging parent)  5 Point Likert rating baseline date: 04/25/2024 Target Date Goal Was reviewed Status Code Progress towards goal/Likert rating  04/25/2025 04/25/2024           N                        This plan has been reviewed and created by the following participants:  This plan will be reviewed at least every 12 months. Date Behavioral Health Clinician Date Guardian/Patient   04/28/2022  Joel Mayer, [name]   04/28/2022 Joel Mayer  05/11/2023 Joel Mayer, [name]  05/11/2023 Joel Mayer  04/25/2024 Joel Mayer, [name]  04/25/2024 Joel Mayer          Diagnosis:  Generalized Anxiety Disorder Major Depressive Disorder, recurrent, in partial remission   Banks reports that family life is about to change as his wife returns to work after 20 years of staying at home.  We d/e/p the ways life will be different, any preparations they've made, and the adjustments/preparations they need to make.  As we were d/ family life, I noted that they don't seem to make much time for each other.  We d/e/p they're focus on the children's needs, not being intentional about time for each other, and ways to begin adjusting their focus.  We acknowledged the risks to married couples who don't make tine for one another as their children become more independent.  Home Practice: daily journaling on prompts d/ in therapy  Joel Jenkins Sprung, PhD   ========================================  Wife: Joel Mayer Daughters:  Joel Mayer - [employer] - 8th Grade @ [address]  [name], PhD

## 2024-07-04 ENCOUNTER — Ambulatory Visit: Admitting: Psychology

## 2024-07-04 DIAGNOSIS — F3341 Major depressive disorder, recurrent, in partial remission: Secondary | ICD-10-CM

## 2024-07-04 DIAGNOSIS — F411 Generalized anxiety disorder: Secondary | ICD-10-CM | POA: Diagnosis not present

## 2024-07-04 NOTE — Progress Notes (Signed)
 PROGRESS NOTE:   Name: Joel Mayer Date: 07/04/2024 MRN: [id number] DOB: September 16, 1976 PCP: Onita Norleen, MD  Time spent: 8:00 AM - 8:57 AM   Annual Review: 04/25/2025   Today I met with  Joel Mayer in remote video (Caregility) face-to-face individual psychotherapy.   Distance Site: Client's Home Orginating Site: Dr Edison Remote Office Consent: Obtained verbal consent to transmit session remotely. Patient is aware of the inherent limitations in participating in virtual therapy.      Presenting Problem: Joel Mayer is a 48 y.o. Joel Mayer who came to therapy for help with his depression.  He states that in the Fall and winter he experiences a pattern of depression consistent with SAD.  While he feels that things are better, he would like some assistance and moving forward.  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  He has been prescribed Prozac 60 mg 17 years.  He was in therapy with Dr. Arzella until her retirement.  Dr. Arzella referred Pt to this therapist.    Background History: Pt is married to Joel Mayer (47) married since September of '07.  This is a first marriage for both of them. They have two Mayer together. 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  Pt reports periodic conflicts with wife and need to be more involved with his Mayer.  Father is now deceased and he has an aging mother with whom he has a conflictual relationship.  Father had a long history of depression which went untreated most of his life.  His death was sudden and unexpected.  This year was a difficult one as he struggled to grieve, review their history together and changing dynamics with his mother as a result of this lose.  Mental Status Exam:  Appearance: Casual    Behavior: Appropriate Motor: Normal Speech/Language: Normal Rate Affect: Appropriate Mood: normal Thought process: normal Thought content: WNL Sensory/Perceptual disturbances: WNL Orientation: oriented to person Attention:  Good Concentration: Good Memory: Immediate;   Good Fund of knowledge: Good Insight: Good Judgment: Good Impulse Control: Good   Individualized Treatment Plan                Strengths: Intelligent, resourceful, loyal, good problem solver, persistent, deals with adversity, friendly, humorous  Supports: spouse, friends (mostly out of town), mother   Goal/Needs for Treatment:  In order of importance to patient 1) Learn and implement mindfulness techniques for the purposes of relapse prevention. 2) Increased skills, effectiveness, and confidence in parenting 3) For interpersonal deficits, help the client develop new interpersonal skills and relationships.  4) Develop healthy interpersonal relationships that lead to the alleviation and help prevent  the relapse of depression.   Client Statement of Needs: Requires assistance of being more aware and mindful in his day to day life experience especially as his mood and behaviors impact his relationships with his wife and children.  A focus on relapse prevention related to depression and maintenance, .   Treatment Level: Biweekly Individual Outpatient Psychotherapy  Symptoms:  History of chronic or recurrent depression for which the client takes antidepressant medication, diminished interest in or enjoyment of activities, hypersensitivity to the criticism or disapproval of others, Reluctant involvement in social situations out of fear of saying or doing something foolish or of becoming emotional in front of others, reports difficulty in managing the challenging problem behavior of their child, social withdrawal. Displays deficits in parenting knowledge and skills and lacks knowledge regarding reasonable expectations for a child's behavior  at a given developmental level.  Joel Mayer.  Client Treatment Preferences: continue treatment with current therapist   [name]:  Psychologist, Joel Mayer, [name] Mayer  support the patient's ability to achieve the goals identified. Cognitive Behavioral Therapy, Dialectical Behavioral Therapy, Motivational Interviewing, behavior activation, parent training, and other evidenced-based practices Mayer be used to promote progress towards healthy functioning.   [name]: Actively participate in therapy, working towards healthy functioning.    *Justification for Continuation/Discontinuation of Goal: R=Revised, O=Ongoing, A=Achieved, D=Discontinued  Goal 1) Learn and implement mindfulness techniques for the purposes of relapse prevention.  5 Point Likert rating baseline date: 04/29/2021 Target Date Goal Was reviewed Status Code Progress towards goal/Likert rating  04/30/2023 04/28/2022          O 1/5 - pt has only a minimal level of the self awareness necessary to activate coping skills in a timely manner, unable to identify emotions  05/10/2024 05/11/2023          O 2/5 - pt demonstrates minimal progress, shows little self awareness necessary to activate coping skills in a timely manner, unable to identify emotions  04/25/2025 04/25/2024          O 2.5/5 - pt demonstrates minimal progress, minor growth in self awareness necessary to activate coping skills in a timely manner, unable to identify emotions   Goal 2) Increased skills, effectiveness, and confidence in parenting  5 Point Likert rating baseline date: 04/29/2021 Target Date Goal Was reviewed Status Code Progress towards goal/Likert rating  04/30/2023 04/28/2022          O 2/5 - increased awareness of children's needs, increased level of attachment and engagement  05/10/2024 05/11/2023          O 4/5 - demonstrated the most progress in feeling more effective and engaged as a parent  04/25/2025 04/25/2024          O 4.5/5 - increased awareness of the needs of adolescent girls, continues to increase level of attachment and engagement   Goal 3) For interpersonal deficits, help the client develop  new interpersonal skills, decrease social anxiety, and relationships.   5 Point Likert rating baseline date: 04/29/2021 Target Date Goal Was reviewed Status Code Progress towards goal/Likert rating   04/30/2023  04/28/2022           O  1/5 - pt continues to avoid social opportunities, makes little effort to mobilize around goal to improve interpersonal interactions outside of immediate family   05/10/2024  05/11/2023           O  1/5 - pt made no progress, see need to focus on this area of his life this year   04/25/2025  04/25/2024           O  1.5/5 - pt made the least amount of progress in this area, and admits he put little effort here and is trying focus more on social relationship this year   Goal 4) Develop healthy interpersonal relationships that lead to the alleviation and help prevent  the relapse of depression.    5 Point Likert rating baseline date: 04/29/2021 Target Date Goal Was reviewed Status Code Progress towards goal/Likert rating  04/30/2023 04/28/2022          O 1/5 - pt continues to avoid social opportunities, makes little effort to mobilize around goal to improve interpersonal interactions outside of immediate family  05/10/2024 05/11/2023  O 1/5 - pt made no progress, see need to focus on this area of his life this year  04/25/2025 04/25/2024          O 1.5/5 - pt made the least amount of progress in this area, and admits he put little effort here and is trying focus more on social relationship this year   Goal 4) Alleviate work dissatisfaction which is a significant source of stress  5 Point Likert rating baseline date: 04/25/2024 Target Date Goal Was reviewed Status Code Progress towards goal/Likert rating  04/25/2025 04/25/2024          N                       Goal 5) Phase of Life change (only adult child of an aging parent)  5 Point Likert rating baseline date: 04/25/2024 Target Date Goal Was reviewed Status Code Progress towards goal/Likert rating  04/25/2025 04/25/2024           N                        This plan has been reviewed and created by the following participants:  This plan Mayer be reviewed at least every 12 months. Date Behavioral Health Clinician Date Guardian/Patient   04/28/2022  Joel Mayer, [name]   04/28/2022 Joel Joel Mayer [place]  05/11/2023 Joel Mayer, [name]  05/11/2023 Joel Joel Mayer [place]  04/25/2024 Joel Mayer, [name]  04/25/2024 Joel Joel Mayer [place]          Diagnosis:  Generalized Anxiety Disorder Major Depressive Disorder, recurrent, in partial remission   Joel Mayer reports that it's been a hellish week.  He states that his eldest daughter got into an accident and suffered a severe concussions with a subdural hematoma.  We d/p his feelings, thoughts and anxieties related to his Mayer condition.  We also d/p his concerns and anxieties for his younger daughter, who is about to begin her freshman year of high school, and how his eldest daughter's behavior might influence his youngest daughter.  Lastly, Joel Mayer admitted that he had a bit of a blowout with an employee.  We d/p how this was related to the stress he was experiencing related to his daughter's recent accident.  I encouraged him to take a couple of days off of work to rest and regroup.  I normalized his responses and encouraged him take to listen to the signs indicating that he needed some time off from work.   Home Practice: daily journaling on prompts d/ in therapy  Joel Jenkins Sprung, PhD   ========================================  Wife: Joel Mayer:  Joel Mayer - [employer] @ [employer] - 8th Grade @ [address]  Joel Jenkins Sprung, PhD

## 2024-07-18 ENCOUNTER — Ambulatory Visit (INDEPENDENT_AMBULATORY_CARE_PROVIDER_SITE_OTHER): Admitting: Psychology

## 2024-07-18 DIAGNOSIS — F3341 Major depressive disorder, recurrent, in partial remission: Secondary | ICD-10-CM | POA: Diagnosis not present

## 2024-07-18 DIAGNOSIS — F411 Generalized anxiety disorder: Secondary | ICD-10-CM | POA: Diagnosis not present

## 2024-07-18 NOTE — Progress Notes (Signed)
 PROGRESS NOTE:   Name: Ashtian Villacis Date: 07/18/2024 MRN: [id number] DOB: 1975-12-12 PCP: Onita Norleen, MD  Time spent: 8:00 AM - 8:57 AM   Annual Review: 04/25/2025   Today I met with  [name] in remote video (Caregility) face-to-face individual psychotherapy.   Distance Site: Client's Home Orginating Site: Dr Edison Remote Office Consent: Obtained verbal consent to transmit session remotely. Patient is aware of the inherent limitations in participating in virtual therapy.      Presenting Problem: [name] is a 48 y.o. WMM who came to therapy for help with his depression.  He states that in the Fall and winter he experiences a pattern of depression consistent with SAD.  While he feels that things are better, he would like some assistance and moving forward.  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  He has been prescribed Prozac 60 mg 17 years.  He was in therapy with Dr. Arzella until her retirement.  Dr. Arzella referred Pt to this therapist.    Background History: Pt is married to Roe (48) married since September of '07.  This is a first marriage for both of them. They have two daughters together. [name] is a Holiday representative in high school and [name] is a Chief Operating Officer.  Pt reports periodic conflicts with wife and need to be more involved with his daughters.  Father is now deceased and he has an aging mother with whom he has a conflictual relationship.  Father had a long history of depression which went untreated most of his life.  His death was sudden and unexpected.  This year was a difficult one as he struggled to grieve, review their history together and changing dynamics with his mother as a result of this lose.  Mental Status Exam:  Appearance: Casual    Behavior: Appropriate Motor: Normal Speech/Language: Normal Rate Affect: Appropriate Mood: normal Thought process: normal Thought content: WNL Sensory/Perceptual disturbances: WNL Orientation: oriented to person Attention:  Good Concentration: Good Memory: Immediate;   Good Fund of knowledge: Good Insight: Good Judgment: Good Impulse Control: Good   Individualized Treatment Plan                Strengths: Intelligent, resourceful, loyal, good problem solver, persistent, deals with adversity, friendly, humorous  Supports: spouse, friends (mostly out of town), mother   Goal/Needs for Treatment:  In order of importance to patient 1) Learn and implement mindfulness techniques for the purposes of relapse prevention. 2) Increased skills, effectiveness, and confidence in parenting 3) For interpersonal deficits, help the client develop new interpersonal skills and relationships.  4) Develop healthy interpersonal relationships that lead to the alleviation and help prevent  the relapse of depression.   Client Statement of Needs: Requires assistance of being more aware and mindful in his day to day life experience especially as his mood and behaviors impact his relationships with his wife and children.  A focus on relapse prevention related to depression and maintenance, .   Treatment Level: Biweekly Individual Outpatient Psychotherapy  Symptoms:  History of chronic or recurrent depression for which the client takes antidepressant medication, diminished interest in or enjoyment of activities, hypersensitivity to the criticism or disapproval of others, Reluctant involvement in social situations out of fear of saying or doing something foolish or of becoming emotional in front of others, reports difficulty in managing the challenging problem behavior of their child, social withdrawal. Displays deficits in parenting knowledge and skills and lacks knowledge regarding reasonable expectations for a child's behavior  at a given developmental level.  [name].  Client Treatment Preferences: continue treatment with current therapist   [name]:  Psychologist, [name], [name] will  support the patient's ability to achieve the goals identified. Cognitive Behavioral Therapy, Dialectical Behavioral Therapy, Motivational Interviewing, behavior activation, parent training, and other evidenced-based practices will be used to promote progress towards healthy functioning.   Healthcare consumer Mauri Tolen will: Actively participate in therapy, working towards healthy functioning.    *Justification for Continuation/Discontinuation of Goal: R=Revised, O=Ongoing, A=Achieved, D=Discontinued  Goal 1) Learn and implement mindfulness techniques for the purposes of relapse prevention.  5 Point Likert rating baseline date: 04/29/2021 Target Date Goal Was reviewed Status Code Progress towards goal/Likert rating  04/30/2023 04/28/2022          O 1/5 - pt has only a minimal level of the self awareness necessary to activate coping skills in a timely manner, unable to identify emotions  05/10/2024 05/11/2023          O 2/5 - pt demonstrates minimal progress, shows little self awareness necessary to activate coping skills in a timely manner, unable to identify emotions  04/25/2025 04/25/2024          O 2.5/5 - pt demonstrates minimal progress, minor growth in self awareness necessary to activate coping skills in a timely manner, unable to identify emotions   Goal 2) Increased skills, effectiveness, and confidence in parenting  5 Point Likert rating baseline date: 04/29/2021 Target Date Goal Was reviewed Status Code Progress towards goal/Likert rating  04/30/2023 04/28/2022          O 2/5 - increased awareness of children's needs, increased level of attachment and engagement  05/10/2024 05/11/2023          O 4/5 - demonstrated the most progress in feeling more effective and engaged as a parent  04/25/2025 04/25/2024          O 4.5/5 - increased awareness of the needs of adolescent girls, continues to increase level of attachment and engagement   Goal 3) For interpersonal deficits, help the client develop  new interpersonal skills, decrease social anxiety, and relationships.   5 Point Likert rating baseline date: 04/29/2021 Target Date Goal Was reviewed Status Code Progress towards goal/Likert rating   04/30/2023  04/28/2022           O  1/5 - pt continues to avoid social opportunities, makes little effort to mobilize around goal to improve interpersonal interactions outside of immediate family   05/10/2024  05/11/2023           O  1/5 - pt made no progress, see need to focus on this area of his life this year   04/25/2025  04/25/2024           O  1.5/5 - pt made the least amount of progress in this area, and admits he put little effort here and is trying focus more on social relationship this year   Goal 4) Develop healthy interpersonal relationships that lead to the alleviation and help prevent  the relapse of depression.    5 Point Likert rating baseline date: 04/29/2021 Target Date Goal Was reviewed Status Code Progress towards goal/Likert rating  04/30/2023 04/28/2022          O 1/5 - pt continues to avoid social opportunities, makes little effort to mobilize around goal to improve interpersonal interactions outside of immediate family  05/10/2024 05/11/2023  O 1/5 - pt made no progress, see need to focus on this area of his life this year  04/25/2025 04/25/2024          O 1.5/5 - pt made the least amount of progress in this area, and admits he put little effort here and is trying focus more on social relationship this year   Goal 4) Alleviate work dissatisfaction which is a significant source of stress  5 Point Likert rating baseline date: 04/25/2024 Target Date Goal Was reviewed Status Code Progress towards goal/Likert rating  04/25/2025 04/25/2024          N                       Goal 5) Phase of Life change (only adult child of an aging parent)  5 Point Likert rating baseline date: 04/25/2024 Target Date Goal Was reviewed Status Code Progress towards goal/Likert rating  04/25/2025 04/25/2024           N                        This plan has been reviewed and created by the following participants:  This plan will be reviewed at least every 12 months. Date Behavioral Health Clinician Date Guardian/Patient   04/28/2022  [name], [name]   04/28/2022 Norleen Banks [place]  05/11/2023 [name], [name]  05/11/2023 Norleen Banks Weisenberger  04/25/2024 [name], [name]  04/25/2024 Norleen Banks [place]          Diagnosis:  Generalized Anxiety Disorder Major Depressive Disorder, recurrent, in partial remission   Banks reports that [name] appears to be healing better and quicker than expected.  We d/p his anxieties about the outcome, managing his anxious thoughts and gratitude.  Cathlyn states that he plans to reach out to his mother to make arrangements for short visit.  We d/e/p the challenges this presents to him, recognizing that it is a small cost for bigger pay out with regards to his mother, and learning to manage his feelings of resentment.  Lastly, we d/e possible courses he could take that would give him something interesting to engage with during his free time.  As always I emphasized the importance of engaging in interesting and enjoyable activities to maintain positive mood states.  Home Practice: daily journaling on prompts d/ in therapy  [name], PhD   ========================================  Wife: Powell Daughters:  [name] - Junior @ [employer] - Freshman @ [employer]  [name], PhD

## 2024-08-01 ENCOUNTER — Ambulatory Visit (INDEPENDENT_AMBULATORY_CARE_PROVIDER_SITE_OTHER): Admitting: Psychology

## 2024-08-01 DIAGNOSIS — F411 Generalized anxiety disorder: Secondary | ICD-10-CM

## 2024-08-01 DIAGNOSIS — F3341 Major depressive disorder, recurrent, in partial remission: Secondary | ICD-10-CM

## 2024-08-01 NOTE — Progress Notes (Signed)
 PROGRESS NOTE:   Name: Joel Mayer Date: 08/01/2024 MRN: [id number] DOB: 10-23-1976 PCP: Onita Norleen, MD  Time spent: 8:00 AM - 8:57 AM   Annual Review: 04/25/2025   Today I met with  Joel Mayer in remote video (Caregility) face-to-face individual psychotherapy.   Distance Site: Client's Home Orginating Site: Dr Edison Remote Office Consent: Obtained verbal consent to transmit session remotely. Patient is aware of the inherent limitations in participating in virtual therapy.      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  He states that in the Fall and winter he experiences a pattern of depression consistent with SAD.  While he feels that things are better, he would like some assistance and moving forward.  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  He has been prescribed Prozac 60 mg 17 years.  He was in therapy with Dr. Arzella until her retirement.  Dr. Arzella referred Pt to this therapist.    Background History: Pt is married to [name] (48) married since September of '07.  This is a first marriage for both of them. They have two daughters together. [name] is a Holiday representative in high school and [name] is a Chief Operating Officer.  Pt reports periodic conflicts with wife and need to be more involved with his daughters.  Father is now deceased and he has an aging mother with whom he has a conflictual relationship.  Father had a long history of depression which went untreated most of his life.  His death was sudden and unexpected.  This year was a difficult one as he struggled to grieve, review their history together and changing dynamics with his mother as a result of this lose.  Mental Status Exam:  Appearance: Casual    Behavior: Appropriate Motor: Normal Speech/Language: Normal Rate Affect: Appropriate Mood: normal Thought process: normal Thought content: WNL Sensory/Perceptual disturbances: WNL Orientation: oriented to  person Attention: Good Concentration: Good Memory: Immediate;   Good Fund of knowledge: Good Insight: Good Judgment: Good Impulse Control: Good   Individualized Treatment Plan                Strengths: Intelligent, resourceful, loyal, good problem solver, persistent, deals with adversity, friendly, humorous  Supports: spouse, friends (mostly out of town), mother   Goal/Needs for Treatment:  In order of importance to patient 1) Learn and implement mindfulness techniques for the purposes of relapse prevention. 2) Increased skills, effectiveness, and confidence in parenting 3) For interpersonal deficits, help the client develop new interpersonal skills and relationships.  4) Develop healthy interpersonal relationships that lead to the alleviation and help prevent  the relapse of depression.   Client Statement of Needs: Requires assistance of being more aware and mindful in his day to day life experience especially as his mood and behaviors impact his relationships with his wife and children.  A focus on relapse prevention related to depression and maintenance, .   Treatment Level: Biweekly Individual Outpatient Psychotherapy  Symptoms:  History of chronic or recurrent depression for which the client takes antidepressant medication, diminished interest in or enjoyment of activities, hypersensitivity to the criticism or disapproval of others, Reluctant involvement in social situations out of fear of saying or doing something foolish or of becoming emotional in front of others, reports difficulty in managing the challenging problem behavior of their child, social withdrawal. Displays deficits in parenting knowledge and skills and lacks knowledge regarding reasonable expectations for  a child's behavior at a given developmental level.  [name].  Client Treatment Preferences: continue treatment with current therapist   [name]:  Psychologist, [name], [name] will support the patient's ability to achieve the goals identified. Cognitive Behavioral Therapy, Dialectical Behavioral Therapy, Motivational Interviewing, behavior activation, parent training, and other evidenced-based practices will be used to promote progress towards healthy functioning.   Healthcare consumer Micael Barb will: Actively participate in therapy, working towards healthy functioning.    *Justification for Continuation/Discontinuation of Goal: R=Revised, O=Ongoing, A=Achieved, D=Discontinued  Goal 1) Learn and implement mindfulness techniques for the purposes of relapse prevention.  5 Point Likert rating baseline date: 04/29/2021 Target Date Goal Was reviewed Status Code Progress towards goal/Likert rating  04/30/2023 04/28/2022          O 1/5 - pt has only a minimal level of the self awareness necessary to activate coping skills in a timely manner, unable to identify emotions  05/10/2024 05/11/2023          O 2/5 - pt demonstrates minimal progress, shows little self awareness necessary to activate coping skills in a timely manner, unable to identify emotions  04/25/2025 04/25/2024          O 2.5/5 - pt demonstrates minimal progress, minor growth in self awareness necessary to activate coping skills in a timely manner, unable to identify emotions   Goal 2) Increased skills, effectiveness, and confidence in parenting  5 Point Likert rating baseline date: 04/29/2021 Target Date Goal Was reviewed Status Code Progress towards goal/Likert rating  04/30/2023 04/28/2022          O 2/5 - increased awareness of children's needs, increased level of attachment and engagement  05/10/2024 05/11/2023          O 4/5 - demonstrated the most progress in feeling more effective and engaged as a parent  04/25/2025 04/25/2024          O 4.5/5 - increased awareness of the needs of adolescent girls, continues to increase level of attachment and engagement   Goal 3) For interpersonal deficits, help  the client develop new interpersonal skills, decrease social anxiety, and relationships.   5 Point Likert rating baseline date: 04/29/2021 Target Date Goal Was reviewed Status Code Progress towards goal/Likert rating   04/30/2023  04/28/2022           O  1/5 - pt continues to avoid social opportunities, makes little effort to mobilize around goal to improve interpersonal interactions outside of immediate family   05/10/2024  05/11/2023           O  1/5 - pt made no progress, see need to focus on this area of his life this year   04/25/2025  04/25/2024           O  1.5/5 - pt made the least amount of progress in this area, and admits he put little effort here and is trying focus more on social relationship this year   Goal 4) Develop healthy interpersonal relationships that lead to the alleviation and help prevent  the relapse of depression.    5 Point Likert rating baseline date: 04/29/2021 Target Date Goal Was reviewed Status Code Progress towards goal/Likert rating  04/30/2023 04/28/2022          O 1/5 - pt continues to avoid social opportunities, makes little effort to mobilize around goal to improve interpersonal interactions outside of immediate family  05/10/2024 05/11/2023  O 1/5 - pt made no progress, see need to focus on this area of his life this year  04/25/2025 04/25/2024          O 1.5/5 - pt made the least amount of progress in this area, and admits he put little effort here and is trying focus more on social relationship this year   Goal 4) Alleviate work dissatisfaction which is a significant source of stress  5 Point Likert rating baseline date: 04/25/2024 Target Date Goal Was reviewed Status Code Progress towards goal/Likert rating  04/25/2025 04/25/2024          N                       Goal 5) Phase of Life change (only adult child of an aging parent)  5 Point Likert rating baseline date: 04/25/2024 Target Date Goal Was reviewed Status Code Progress towards goal/Likert rating   04/25/2025 04/25/2024          N                        This plan has been reviewed and created by the following participants:  This plan will be reviewed at least every 12 months. Date Behavioral Health Clinician Date Guardian/Patient   04/28/2022  [name], [name]   04/28/2022 Joel Banks Rask  05/11/2023 [name], [name]  05/11/2023 Joel Banks Bonet  04/25/2024 [name], [name]  04/25/2024 Joel Banks Danser          Diagnosis:  Generalized Anxiety Disorder Major Depressive Disorder, recurrent, in partial remission   Banks reports that [name] continues to recover and adding more of her normal activities t(in small doses) to her schedule.  He did however, have some anxieties about his younger daughter [name] who has difficulties with anxiety.  We d/e/p managing expectations, managing perfectionism, and providing guidance and support with all pressure.  As for himself, Banks has been feeling more anxious and is finding it difficult to not be affected by current political affairs.  We d/ ways to manage stress and anxiety and strategies for improving the quality of his sleep.  Home Practice: daily journaling on prompts d/ in therapy  [name], PhD   ========================================  Wife: Powell Daughters:  [name] - Junior @ [employer] [place] - Freshman @ [employer]

## 2024-08-15 ENCOUNTER — Ambulatory Visit: Admitting: Psychology

## 2024-08-15 DIAGNOSIS — F3341 Major depressive disorder, recurrent, in partial remission: Secondary | ICD-10-CM | POA: Diagnosis not present

## 2024-08-15 DIAGNOSIS — F411 Generalized anxiety disorder: Secondary | ICD-10-CM | POA: Diagnosis not present
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
## 2024-08-29 ENCOUNTER — Ambulatory Visit: Admitting: Psychology

## 2024-08-29 DIAGNOSIS — F3341 Major depressive disorder, recurrent, in partial remission: Secondary | ICD-10-CM | POA: Diagnosis not present

## 2024-08-29 DIAGNOSIS — F411 Generalized anxiety disorder: Secondary | ICD-10-CM | POA: Diagnosis not present

## 2024-08-29 NOTE — Progress Notes (Signed)
 PROGRESS NOTE:   Name: Joel Mayer Date: 08/29/2024 MRN: [id number] DOB: Oct 31, 1976 PCP: Joel Norleen, MD  Time spent: 8:00 AM - 8:58 AM   Annual Review: 04/25/2025   Today I met with  Joel Mayer in remote video (Caregility) face-to-face individual psychotherapy.   Distance Site: Client's Home Orginating Site: Joel Mayer Remote Office Consent: Obtained verbal consent to transmit session remotely. Patient is aware of the inherent limitations in participating in virtual therapy.      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  He states that in the Fall and winter he experiences a pattern of depression consistent with SAD.  While he feels that things are better, he would like some assistance and moving forward.  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  He has been prescribed Prozac 60 mg 17 years.  He was in therapy with Joel. Arzella until her retirement.  Joel. Arzella referred Pt to this therapist.    Background History: Pt is married to Joel Mayer (47) married since September of '07.  This is a first marriage for both of them. They have two daughters together. 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  Pt reports periodic conflicts with wife and need to be more involved with his daughters.  Father is now deceased and he has an aging mother with whom he has a conflictual relationship.  Father had a long history of depression which went untreated most of his life.  His death was sudden and unexpected.  This year was a difficult one as he struggled to grieve, review their history together and changing dynamics with his mother as a result of this lose.  Mental Status Exam:  Appearance: Casual    Behavior: Appropriate Motor: Normal Speech/Language: Normal Rate Affect: Appropriate Mood: normal Thought process: normal Thought content: WNL Sensory/Perceptual disturbances: WNL Orientation: oriented to  person Attention: Good Concentration: Good Memory: Immediate;   Good Fund of knowledge: Good Insight: Good Judgment: Good Impulse Control: Good   Individualized Treatment Plan                Strengths: Intelligent, resourceful, loyal, good problem solver, persistent, deals with adversity, friendly, humorous  Supports: spouse, friends (mostly out of town), mother   Goal/Needs for Treatment:  In order of importance to patient 1) Learn and implement mindfulness techniques for the purposes of relapse prevention. 2) Increased skills, effectiveness, and confidence in parenting 3) For interpersonal deficits, help the client develop new interpersonal skills and relationships.  4) Develop healthy interpersonal relationships that lead to the alleviation and help prevent  the relapse of depression.   Client Statement of Needs: Requires assistance of being more aware and mindful in his day to day life experience especially as his mood and behaviors impact his relationships with his wife and children.  A focus on relapse prevention related to depression and maintenance, .   Treatment Level: Biweekly Individual Outpatient Psychotherapy  Symptoms:  History of chronic or recurrent depression for which the client takes antidepressant medication, diminished interest in or enjoyment of activities, hypersensitivity to the criticism or disapproval of others, Reluctant involvement in social situations out of fear of saying or doing something foolish or of becoming emotional in front of others, reports difficulty in managing the challenging problem behavior of their child, social withdrawal. Displays deficits in parenting knowledge and skills and lacks knowledge regarding reasonable expectations  for a child's behavior at a given developmental level.  Joel Mayer.  Client Treatment Preferences: continue treatment with current therapist   [name]:  Psychologist, Joel Mayer, [name] will support the patient's ability to achieve the goals identified. Cognitive Behavioral Therapy, Dialectical Behavioral Therapy, Motivational Interviewing, behavior activation, parent training, and other evidenced-based practices will be used to promote progress towards healthy functioning.   [name] will: Actively participate in therapy, working towards healthy functioning.    *Justification for Continuation/Discontinuation of Goal: R=Revised, O=Ongoing, A=Achieved, D=Discontinued  Goal 1) Learn and implement mindfulness techniques for the purposes of relapse prevention.  5 Point Likert rating baseline date: 04/29/2021 Target Date Goal Was reviewed Status Code Progress towards goal/Likert rating  04/30/2023 04/28/2022          O 1/5 - pt has only a minimal level of the self awareness necessary to activate coping skills in a timely manner, unable to identify emotions  05/10/2024 05/11/2023          O 2/5 - pt demonstrates minimal progress, shows little self awareness necessary to activate coping skills in a timely manner, unable to identify emotions  04/25/2025 04/25/2024          O 2.5/5 - pt demonstrates minimal progress, minor growth in self awareness necessary to activate coping skills in a timely manner, unable to identify emotions   Goal 2) Increased skills, effectiveness, and confidence in parenting  5 Point Likert rating baseline date: 04/29/2021 Target Date Goal Was reviewed Status Code Progress towards goal/Likert rating  04/30/2023 04/28/2022          O 2/5 - increased awareness of children's needs, increased level of attachment and engagement  05/10/2024 05/11/2023          O 4/5 - demonstrated the most progress in feeling more effective and engaged as a parent  04/25/2025 04/25/2024          O 4.5/5 - increased awareness of the needs of adolescent girls, continues to increase level of attachment and engagement   Goal 3) For interpersonal deficits, help  the client develop new interpersonal skills, decrease social anxiety, and relationships.   5 Point Likert rating baseline date: 04/29/2021 Target Date Goal Was reviewed Status Code Progress towards goal/Likert rating   04/30/2023  04/28/2022           O  1/5 - pt continues to avoid social opportunities, makes little effort to mobilize around goal to improve interpersonal interactions outside of immediate family   05/10/2024  05/11/2023           O  1/5 - pt made no progress, see need to focus on this area of his life this year   04/25/2025  04/25/2024           O  1.5/5 - pt made the least amount of progress in this area, and admits he put little effort here and is trying focus more on social relationship this year   Goal 4) Develop healthy interpersonal relationships that lead to the alleviation and help prevent  the relapse of depression.    5 Point Likert rating baseline date: 04/29/2021 Target Date Goal Was reviewed Status Code Progress towards goal/Likert rating  04/30/2023 04/28/2022          O 1/5 - pt continues to avoid social opportunities, makes little effort to mobilize around goal to improve interpersonal interactions outside of immediate family  05/10/2024 05/11/2023  O 1/5 - pt made no progress, see need to focus on this area of his life this year  04/25/2025 04/25/2024          O 1.5/5 - pt made the least amount of progress in this area, and admits he put little effort here and is trying focus more on social relationship this year   Goal 4) Alleviate work dissatisfaction which is a significant source of stress  5 Point Likert rating baseline date: 04/25/2024 Target Date Goal Was reviewed Status Code Progress towards goal/Likert rating  04/25/2025 04/25/2024          N                       Goal 5) Phase of Life change (only adult child of an aging parent)  5 Point Likert rating baseline date: 04/25/2024 Target Date Goal Was reviewed Status Code Progress towards goal/Likert rating   04/25/2025 04/25/2024          N                        This plan has been reviewed and created by the following participants:  This plan will be reviewed at least every 12 months. Date Behavioral Health Clinician Date Guardian/Patient   04/28/2022  Joel Mayer, [name]   04/28/2022 Joel Mayer  05/11/2023 Joel Mayer, [name]  05/11/2023 Joel Mayer  04/25/2024 Joel Mayer, [name]  04/25/2024 Joel Mayer          Diagnosis:  Generalized Anxiety Disorder Major Depressive Disorder, recurrent, in partial remission   Joel Mayer reports that they have been trying to plan what's next.   After his daughter Joel Mayer's car accident they've needed to make a decision about a replacement car.  We d/e/p feeling less anxious about Joel Mayer getting behind a car wheel again, willingness to spend the money to have all the bells and whistles safety features, and the need for peace of mind.  We also d/p that he has been more consistent with his morning exercise, is using his therapy light a few times a week and working towards everyday, and feeling good about himself in general.  Pt has been informed of my upcoming vacation next month, and I confirmed pt's upcoming appointments.  Home Practice: daily journaling on prompts d/ in therapy, use phot therapy lamp 1x/day for 15-20 minutes in the morning  Joel Jenkins Sprung, PhD   ========================================  Wife: Powell Daughters:  Joel Mayer - Junior @ [employer] [place] - Freshman @ [employer]

## 2024-09-12 ENCOUNTER — Ambulatory Visit: Admitting: Psychology

## 2024-09-12 DIAGNOSIS — F411 Generalized anxiety disorder: Secondary | ICD-10-CM

## 2024-09-12 DIAGNOSIS — F3341 Major depressive disorder, recurrent, in partial remission: Secondary | ICD-10-CM

## 2024-09-12 NOTE — Progress Notes (Signed)
 PROGRESS NOTE:   Name: Linville Decarolis Date: 09/12/2024 MRN: [id number] DOB: 06/23/1976 PCP: Onita Norleen, MD  Time spent: 8:01 AM - 8:59 AM   Annual Review: 04/25/2025   Today I met with  [name] in remote video (Caregility) face-to-face individual psychotherapy.   Distance Site: Client's Home Orginating Site: Dr Edison Remote Office Consent: Obtained verbal consent to transmit session remotely. Patient is aware of the inherent limitations in participating in virtual therapy.      Presenting Problem: [name] is a 48 y.o. WMM who came to therapy for help with his depression.  He states that in the Fall and winter he experiences a pattern of depression consistent with SAD.  While he feels that things are better, he would like some assistance and moving forward.  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  He has been prescribed Prozac 60 mg 17 years.  He was in therapy with Dr. Arzella until her retirement.  Dr. Arzella referred Pt to this therapist.    Background History: Pt is married to Coleraine (48) married since September of '07.  This is a first marriage for both of them. They have two daughters together. [name] is a holiday representative in high school and [name] is a chief operating officer.  Pt reports periodic conflicts with wife and need to be more involved with his daughters.  Father is now deceased and he has an aging mother with whom he has a conflictual relationship.  Father had a long history of depression which went untreated most of his life.  His death was sudden and unexpected.  This year was a difficult one as he struggled to grieve, review their history together and changing dynamics with his mother as a result of this lose.  Mental Status Exam:  Appearance: Casual    Behavior: Appropriate Motor: Normal Speech/Language: Normal Rate Affect: Appropriate Mood: normal Thought process: normal Thought content: WNL Sensory/Perceptual disturbances: WNL Orientation: oriented to  person Attention: Good Concentration: Good Memory: Immediate;   Good Fund of knowledge: Good Insight: Good Judgment: Good Impulse Control: Good   Individualized Treatment Plan                 Strengths: Intelligent, resourceful, loyal, good problem solver, persistent, deals with adversity, friendly, humorous  Supports: spouse, friends (mostly out of town), mother   Goal/Needs for Treatment:  In order of importance to patient 1) Learn and implement mindfulness techniques for the purposes of relapse prevention. 2) Increased skills, effectiveness, and confidence in parenting 3) For interpersonal deficits, help the client develop new interpersonal skills and relationships.  4) Develop healthy interpersonal relationships that lead to the alleviation and help prevent  the relapse of depression.   Client Statement of Needs: Requires assistance of being more aware and mindful in his day to day life experience especially as his mood and behaviors impact his relationships with his wife and children.  A focus on relapse prevention related to depression and maintenance, .   Treatment Level: Biweekly Individual Outpatient Psychotherapy  Symptoms:  History of chronic or recurrent depression for which the client takes antidepressant medication, diminished interest in or enjoyment of activities, hypersensitivity to the criticism or disapproval of others, Reluctant involvement in social situations out of fear of saying or doing something foolish or of becoming emotional in front of others, reports difficulty in managing the challenging problem behavior of their child, social withdrawal. Displays deficits in parenting knowledge and skills and lacks knowledge regarding reasonable  expectations for a child's behavior at a given developmental level.  [name].  Client Treatment Preferences: continue treatment with current therapist   [name]:  Psychologist, [name], [name] will support the patient's ability to achieve the goals identified. Cognitive Behavioral Therapy, Dialectical Behavioral Therapy, Motivational Interviewing, behavior activation, parent training, and other evidenced-based practices will be used to promote progress towards healthy functioning.   Healthcare consumer Tiler Brandis will: Actively participate in therapy, working towards healthy functioning.    *Justification for Continuation/Discontinuation of Goal: R=Revised, O=Ongoing, A=Achieved, D=Discontinued  Goal 1) Learn and implement mindfulness techniques for the purposes of relapse prevention.  5 Point Likert rating baseline date: 04/29/2021 Target Date Goal Was reviewed Status Code Progress towards goal/Likert rating  04/30/2023 04/28/2022          O 1/5 - pt has only a minimal level of the self awareness necessary to activate coping skills in a timely manner, unable to identify emotions  05/10/2024 05/11/2023          O 2/5 - pt demonstrates minimal progress, shows little self awareness necessary to activate coping skills in a timely manner, unable to identify emotions  04/25/2025 04/25/2024          O 2.5/5 - pt demonstrates minimal progress, minor growth in self awareness necessary to activate coping skills in a timely manner, unable to identify emotions   Goal 2) Increased skills, effectiveness, and confidence in parenting  5 Point Likert rating baseline date: 04/29/2021 Target Date Goal Was reviewed Status Code Progress towards goal/Likert rating  04/30/2023 04/28/2022          O 2/5 - increased awareness of children's needs, increased level of attachment and engagement  05/10/2024 05/11/2023          O 4/5 - demonstrated the most progress in feeling more effective and engaged as a parent  04/25/2025 04/25/2024          O 4.5/5 - increased awareness of the needs of adolescent girls, continues to increase level of attachment and engagement   Goal 3) For interpersonal deficits, help  the client develop new interpersonal skills, decrease social anxiety, and relationships.   5 Point Likert rating baseline date: 04/29/2021 Target Date Goal Was reviewed Status Code Progress towards goal/Likert rating   04/30/2023  04/28/2022           O  1/5 - pt continues to avoid social opportunities, makes little effort to mobilize around goal to improve interpersonal interactions outside of immediate family   05/10/2024  05/11/2023           O  1/5 - pt made no progress, see need to focus on this area of his life this year   04/25/2025  04/25/2024           O  1.5/5 - pt made the least amount of progress in this area, and admits he put little effort here and is trying focus more on social relationship this year   Goal 4) Develop healthy interpersonal relationships that lead to the alleviation and help prevent  the relapse of depression.    5 Point Likert rating baseline date: 04/29/2021 Target Date Goal Was reviewed Status Code Progress towards goal/Likert rating  04/30/2023 04/28/2022          O 1/5 - pt continues to avoid social opportunities, makes little effort to mobilize around goal to improve interpersonal interactions outside of immediate family  05/10/2024 05/11/2023  O 1/5 - pt made no progress, see need to focus on this area of his life this year  04/25/2025 04/25/2024          O 1.5/5 - pt made the least amount of progress in this area, and admits he put little effort here and is trying focus more on social relationship this year   Goal 4) Alleviate work dissatisfaction which is a significant source of stress  5 Point Likert rating baseline date: 04/25/2024 Target Date Goal Was reviewed Status Code Progress towards goal/Likert rating  04/25/2025 04/25/2024          N                       Goal 5) Phase of Life change (only adult child of an aging parent)  5 Point Likert rating baseline date: 04/25/2024 Target Date Goal Was reviewed Status Code Progress towards goal/Likert rating   04/25/2025 04/25/2024          N                        This plan has been reviewed and created by the following participants:  This plan will be reviewed at least every 12 months. Date Behavioral Health Clinician Date Guardian/Patient   04/28/2022  [name], [name]   04/28/2022 Norleen Banks Kavanaugh  05/11/2023 [name], [name]  05/11/2023 Norleen Banks Junod  04/25/2024 [name], [name]  04/25/2024 Norleen Banks Seiter          Diagnosis:  Generalized Anxiety Disorder Major Depressive Disorder, recurrent, in partial remission   Banks reports that they had an enjoyable Halloween weekend without any major incidents. We d/p one negative social situation which arose for one of his daughters, what occurred, how he felt and how they helped her to resolve the situation.  Banks has been more consistent in f/t with lifestyle behaviors (ie., exercise, light therapy, social interactions).  I praised him for getting in the right grove to support his physical and mental health and to see him feeling happy about his successes.     Pt was reminded of my upcoming vacation, confirmed pt's upcoming appointments, and informed him of how I could be reached in the case of an emergency.  Home Practice: daily journaling on prompts d/ in therapy, use phot therapy lamp 1x/day for 15-20 minutes in the morning  [name], PhD   ========================================  Wife: Powell Daughters:  [name] - Junior @ [employer] [place] - Freshman @ [employer]

## 2024-10-10 ENCOUNTER — Ambulatory Visit: Admitting: Psychology

## 2024-10-10 DIAGNOSIS — F3341 Major depressive disorder, recurrent, in partial remission: Secondary | ICD-10-CM

## 2024-10-10 DIAGNOSIS — F411 Generalized anxiety disorder: Secondary | ICD-10-CM | POA: Diagnosis not present

## 2024-10-10 NOTE — Progress Notes (Signed)
 PROGRESS NOTE:   Name: Joel Mayer Date: 10/10/2024 MRN: [id number] DOB: 1976/08/10 PCP: Onita Norleen, MD  Time spent: 8:01 AM - 8:59 AM   Annual Review: 04/25/2025   Today I met with  Joel Mayer in remote video (Caregility) face-to-face individual psychotherapy.   Distance Site: Client's Home Orginating Site: Dr Edison Remote Office Consent: Obtained verbal consent to transmit session remotely. Patient is aware of the inherent limitations in participating in virtual therapy.      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  He states that in the Fall and winter he experiences a pattern of depression consistent with SAD.  While he feels that things are better, he would like some assistance and moving forward.  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  He has been prescribed Prozac 60 mg 17 years.  He was in therapy with Dr. Arzella until her retirement.  Dr. Arzella referred Pt to this therapist.    Background History: Pt is married to Joel Mayer (48) married since September of '07.  This is a first marriage for both of them. They have two daughters together. 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  Pt reports periodic conflicts with wife and need to be more involved with his daughters.  Father is now deceased and he has an aging mother with whom he has a conflictual relationship.  Father had a long history of depression which went untreated most of his life.  His death was sudden and unexpected.  This year was a difficult one as he struggled to grieve, review their history together and changing dynamics with his mother as a result of this lose.  Mental Status Exam:  Appearance: Casual    Behavior: Appropriate Motor: Normal Speech/Language: Normal Rate Affect: Appropriate Mood: normal Thought process: normal Thought content: WNL Sensory/Perceptual disturbances: WNL Orientation: oriented to  person Attention: Good Concentration: Good Memory: Immediate;   Good Fund of knowledge: Good Insight: Good Judgment: Good Impulse Control: Good   Individualized Treatment Plan                 Strengths: Intelligent, resourceful, loyal, good problem solver, persistent, deals with adversity, friendly, humorous  Supports: spouse, friends (mostly out of town), mother   Goal/Needs for Treatment:  In order of importance to patient 1) Learn and implement mindfulness techniques for the purposes of relapse prevention. 2) Increased skills, effectiveness, and confidence in parenting 3) For interpersonal deficits, help the client develop new interpersonal skills and relationships.  4) Develop healthy interpersonal relationships that lead to the alleviation and help prevent  the relapse of depression.   Client Statement of Needs: Requires assistance of being more aware and mindful in his day to day life experience especially as his mood and behaviors impact his relationships with his wife and children.  A focus on relapse prevention related to depression and maintenance, .   Treatment Level: Biweekly Individual Outpatient Psychotherapy  Symptoms:  History of chronic or recurrent depression for which the client takes antidepressant medication, diminished interest in or enjoyment of activities, hypersensitivity to the criticism or disapproval of others, Reluctant involvement in social situations out of fear of saying or doing something foolish or of becoming emotional in front of others, reports difficulty in managing the challenging problem behavior of their child, social withdrawal. Displays deficits in parenting knowledge and skills and lacks knowledge regarding reasonable  expectations for a child's behavior at a given developmental level.  [name].  Client Treatment Preferences: continue treatment with current therapist   [name]:  Psychologist, Joel Mayer, [name] will support the patient's ability to achieve the goals identified. Cognitive Behavioral Therapy, Dialectical Behavioral Therapy, Motivational Interviewing, behavior activation, parent training, and other evidenced-based practices will be used to promote progress towards healthy functioning.   [name] will: Actively participate in therapy, working towards healthy functioning.    *Justification for Continuation/Discontinuation of Goal: R=Revised, O=Ongoing, A=Achieved, D=Discontinued  Goal 1) Learn and implement mindfulness techniques for the purposes of relapse prevention.  5 Point Likert rating baseline date: 04/29/2021 Target Date Goal Was reviewed Status Code Progress towards goal/Likert rating  04/30/2023 04/28/2022          O 1/5 - pt has only a minimal level of the self awareness necessary to activate coping skills in a timely manner, unable to identify emotions  05/10/2024 05/11/2023          O 2/5 - pt demonstrates minimal progress, shows little self awareness necessary to activate coping skills in a timely manner, unable to identify emotions  04/25/2025 04/25/2024          O 2.5/5 - pt demonstrates minimal progress, minor growth in self awareness necessary to activate coping skills in a timely manner, unable to identify emotions   Goal 2) Increased skills, effectiveness, and confidence in parenting  5 Point Likert rating baseline date: 04/29/2021 Target Date Goal Was reviewed Status Code Progress towards goal/Likert rating  04/30/2023 04/28/2022          O 2/5 - increased awareness of children's needs, increased level of attachment and engagement  05/10/2024 05/11/2023          O 4/5 - demonstrated the most progress in feeling more effective and engaged as a parent  04/25/2025 04/25/2024          O 4.5/5 - increased awareness of the needs of adolescent girls, continues to increase level of attachment and engagement   Goal 3) For interpersonal deficits, help  the client develop new interpersonal skills, decrease social anxiety, and relationships.   5 Point Likert rating baseline date: 04/29/2021 Target Date Goal Was reviewed Status Code Progress towards goal/Likert rating   04/30/2023  04/28/2022           O  1/5 - pt continues to avoid social opportunities, makes little effort to mobilize around goal to improve interpersonal interactions outside of immediate family   05/10/2024  05/11/2023           O  1/5 - pt made no progress, see need to focus on this area of his life this year   04/25/2025  04/25/2024           O  1.5/5 - pt made the least amount of progress in this area, and admits he put little effort here and is trying focus more on social relationship this year   Goal 4) Develop healthy interpersonal relationships that lead to the alleviation and help prevent  the relapse of depression.    5 Point Likert rating baseline date: 04/29/2021 Target Date Goal Was reviewed Status Code Progress towards goal/Likert rating  04/30/2023 04/28/2022          O 1/5 - pt continues to avoid social opportunities, makes little effort to mobilize around goal to improve interpersonal interactions outside of immediate family  05/10/2024 05/11/2023  O 1/5 - pt made no progress, see need to focus on this area of his life this year  04/25/2025 04/25/2024          O 1.5/5 - pt made the least amount of progress in this area, and admits he put little effort here and is trying focus more on social relationship this year   Goal 4) Alleviate work dissatisfaction which is a significant source of stress  5 Point Likert rating baseline date: 04/25/2024 Target Date Goal Was reviewed Status Code Progress towards goal/Likert rating  04/25/2025 04/25/2024          N                       Goal 5) Phase of Life change (only adult child of an aging parent)  5 Point Likert rating baseline date: 04/25/2024 Target Date Goal Was reviewed Status Code Progress towards goal/Likert rating   04/25/2025 04/25/2024          N                        This plan has been reviewed and created by the following participants:  This plan will be reviewed at least every 12 months. Date Behavioral Health Clinician Date Guardian/Patient   04/28/2022  Joel Mayer, [name]   04/28/2022 Joel Mayer  05/11/2023 Joel Mayer, [name]  05/11/2023 Joel Mayer  04/25/2024 Joel Mayer, [name]  04/25/2024 Joel Mayer          Diagnosis:  Generalized Anxiety Disorder Major Depressive Disorder, recurrent, in partial remission   Joel Mayer reports that Thanksgiving was quiet and went well.  However, the week before he had a visit from the police department related to a report about his mother.  We d/e/p what occurred, concerns about his mother's cognitive health and safety, discovery of earlier financial missteps (scams), and the need to take over her financial matters.  We noted that his mother was amenable to have him manage her financial matters as an added security measure.  Lastly, we d/e/p that Joel Mayer began anticipating empty-nesting and imagining that he will need to figure out how to use his free time and time together with his wife.  We d/p that it is one more reason for him to spend time reflecting on his personal interests and ways in which he may wish to expand his comfort zone.   Home Practice: daily journaling on prompts d/ in therapy, use phot therapy lamp 1x/day for 15-20 minutes in the morning  Joel Jenkins Sprung, PhD   ========================================  Wife: Joel Mayer Daughters:  Joel Mayer - Junior @ [employer] [place] [employer] - Freshman @ [employer]

## 2024-10-24 ENCOUNTER — Ambulatory Visit: Admitting: Psychology

## 2024-10-24 DIAGNOSIS — F3341 Major depressive disorder, recurrent, in partial remission: Secondary | ICD-10-CM

## 2024-10-24 DIAGNOSIS — F411 Generalized anxiety disorder: Secondary | ICD-10-CM

## 2024-10-25 NOTE — Progress Notes (Unsigned)
   Joel Favors, PhD

## 2024-11-07 ENCOUNTER — Ambulatory Visit: Admitting: Psychology

## 2024-11-07 DIAGNOSIS — F411 Generalized anxiety disorder: Secondary | ICD-10-CM

## 2024-11-07 DIAGNOSIS — F3341 Major depressive disorder, recurrent, in partial remission: Secondary | ICD-10-CM | POA: Diagnosis not present

## 2024-11-07 NOTE — Progress Notes (Signed)
 "      PROGRESS NOTE:  Name: Joel Mayer Date: 11/07/2024 MRN: [id number] DOB: 05/11/1976 PCP: Joel Norleen, MD  Time spent: 8:01 AM - 8:59 AM   Annual Review: 04/25/2025   Today I met with  Joel Mayer in remote video (Caregility) face-to-face individual psychotherapy.   Distance Site: Client's Home Orginating Site: Joel Mayer Consent: Obtained verbal consent to transmit session remotely. Patient is aware of the inherent limitations in participating in virtual therapy.      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  He states that in the Fall and winter he experiences a pattern of depression consistent with SAD.  While he feels that things are better, he would like some assistance and moving forward.  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  He has been prescribed Prozac 60 mg 17 years.  He was in therapy with Joel. Arzella until her retirement.  Joel. Arzella referred Pt to this therapist.    Background History: Pt is married to Joel Mayer (48) married since September of 07.  This is a first marriage for both of them. They have two daughters together. 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  Pt reports periodic conflicts with wife and need to be more involved with his daughters.  Father is now deceased and he has an aging mother with whom he has a conflictual relationship.  Father had a long history of depression which went untreated most of his life.  His death was sudden and unexpected.  This year was a difficult one as he struggled to grieve, review their history together and changing dynamics with his mother as a result of this lose.  Mental Status Exam:  Appearance: Casual    Behavior: Appropriate Motor: Normal Speech/Language: Normal Rate Affect: Appropriate Mood: normal Thought process: normal Thought content: WNL Sensory/Perceptual disturbances: WNL Orientation: oriented to  person Attention: Good Concentration: Good Memory: Immediate;   Good Fund of knowledge: Good Insight: Good Judgment: Good Impulse Control: Good   Individualized Treatment Plan                 Strengths: Intelligent, resourceful, loyal, good problem solver, persistent, deals with adversity, friendly, humorous  Supports: spouse, friends (mostly out of town), mother   Goal/Needs for Treatment:  In order of importance to patient 1) Learn and implement mindfulness techniques for the purposes of relapse prevention. 2) Increased skills, effectiveness, and confidence in parenting 3) For interpersonal deficits, help the client develop new interpersonal skills and relationships.  4) Develop healthy interpersonal relationships that lead to the alleviation and help prevent  the relapse of depression.   Client Statement of Needs: Requires assistance of being more aware and mindful in his day to day life experience especially as his mood and behaviors impact his relationships with his wife and children.  A focus on relapse prevention related to depression and maintenance, .   Treatment Level: Biweekly Individual Outpatient Psychotherapy  Symptoms:  History of chronic or recurrent depression for which the client takes antidepressant medication, diminished interest in or enjoyment of activities, hypersensitivity to the criticism or disapproval of others, Reluctant involvement in social situations out of fear of saying or doing something foolish or of becoming emotional in front of others, reports difficulty in managing the challenging problem behavior of their child, social withdrawal. Displays deficits in parenting knowledge and skills and lacks knowledge regarding reasonable  expectations for a child's behavior at a given developmental level.  Joel Mayer.  Client Treatment Preferences: continue treatment with current therapist   [name]:  Psychologist, Joel Mayer, [name] will support the patient's ability to achieve the goals identified. Cognitive Behavioral Therapy, Dialectical Behavioral Therapy, Motivational Interviewing, behavior activation, parent training, and other evidenced-based practices will be used to promote progress towards healthy functioning.   [name] will: Actively participate in therapy, working towards healthy functioning.    *Justification for Continuation/Discontinuation of Goal: R=Revised, O=Ongoing, A=Achieved, D=Discontinued  Goal 1) Learn and implement mindfulness techniques for the purposes of relapse prevention.  5 Point Likert rating baseline date: 04/29/2021 Target Date Goal Was reviewed Status Code Progress towards goal/Likert rating  04/30/2023 04/28/2022          O 1/5 - pt has only a minimal level of the self awareness necessary to activate coping skills in a timely manner, unable to identify emotions  05/10/2024 05/11/2023          O 2/5 - pt demonstrates minimal progress, shows little self awareness necessary to activate coping skills in a timely manner, unable to identify emotions  04/25/2025 04/25/2024          O 2.5/5 - pt demonstrates minimal progress, minor growth in self awareness necessary to activate coping skills in a timely manner, unable to identify emotions   Goal 2) Increased skills, effectiveness, and confidence in parenting  5 Point Likert rating baseline date: 04/29/2021 Target Date Goal Was reviewed Status Code Progress towards goal/Likert rating  04/30/2023 04/28/2022          O 2/5 - increased awareness of children's needs, increased level of attachment and engagement  05/10/2024 05/11/2023          O 4/5 - demonstrated the most progress in feeling more effective and engaged as a parent  04/25/2025 04/25/2024          O 4.5/5 - increased awareness of the needs of adolescent girls, continues to increase level of attachment and engagement   Goal 3) For interpersonal deficits, help  the client develop new interpersonal skills, decrease social anxiety, and relationships.   5 Point Likert rating baseline date: 04/29/2021 Target Date Goal Was reviewed Status Code Progress towards goal/Likert rating   04/30/2023  04/28/2022           O  1/5 - pt continues to avoid social opportunities, makes little effort to mobilize around goal to improve interpersonal interactions outside of immediate family   05/10/2024  05/11/2023           O  1/5 - pt made no progress, see need to focus on this area of his life this year   04/25/2025  04/25/2024           O  1.5/5 - pt made the least amount of progress in this area, and admits he put little effort here and is trying focus more on social relationship this year   Goal 4) Develop healthy interpersonal relationships that lead to the alleviation and help prevent  the relapse of depression.    5 Point Likert rating baseline date: 04/29/2021 Target Date Goal Was reviewed Status Code Progress towards goal/Likert rating  04/30/2023 04/28/2022          O 1/5 - pt continues to avoid social opportunities, makes little effort to mobilize around goal to improve interpersonal interactions outside of immediate family  05/10/2024 05/11/2023  O 1/5 - pt made no progress, see need to focus on this area of his life this year  04/25/2025 04/25/2024          O 1.5/5 - pt made the least amount of progress in this area, and admits he put little effort here and is trying focus more on social relationship this year   Goal 4) Alleviate work dissatisfaction which is a significant source of stress  5 Point Likert rating baseline date: 04/25/2024 Target Date Goal Was reviewed Status Code Progress towards goal/Likert rating  04/25/2025 04/25/2024          N                       Goal 5) Phase of Life change (only adult child of an aging parent)  5 Point Likert rating baseline date: 04/25/2024 Target Date Goal Was reviewed Status Code Progress towards goal/Likert rating   04/25/2025 04/25/2024          N                        This plan has been reviewed and created by the following participants:  This plan will be reviewed at least every 12 months. Date Behavioral Health Clinician Date Guardian/Patient   04/28/2022  Joel Mayer, [name]   04/28/2022 Joel Banks Remedios  05/11/2023 Joel Mayer, [name]  05/11/2023 Joel Banks Macchi  04/25/2024 Joel Mayer, [name]  04/25/2024 Joel Banks Rastetter          Diagnosis:  Generalized Anxiety Disorder Major Depressive Disorder, recurrent, in partial remission   Banks reports that his daughter was back in the ER with chest pain.  We d/e/p what occurred, managing his anxious thoughts, and plans moving forward.  Banks admits that he's fallen out of his routine of exercising and using his therapy lamp and his mood has been lower than normal.  Lastly, we d/ ways to get back on track and the need to incorporate a personal interest into his day.  I provided practical guidance for how to activate his intentions as well as the support needed to f/t.  Home Practice: daily journaling on prompts d/ in therapy, use phot therapy lamp 1x/day for 15-20 minutes in the morning  Joel Jenkins Sprung, PhD   ========================================  Wife: Powell Daughters:  Joel Mayer - Junior [employer] [place] - Freshman @ [employer]  "

## 2024-11-21 ENCOUNTER — Ambulatory Visit: Admitting: Psychology

## 2024-11-21 DIAGNOSIS — F411 Generalized anxiety disorder: Secondary | ICD-10-CM

## 2024-11-21 DIAGNOSIS — F3341 Major depressive disorder, recurrent, in partial remission: Secondary | ICD-10-CM | POA: Diagnosis not present

## 2024-11-21 NOTE — Progress Notes (Signed)
 "      PROGRESS NOTE:  Name: Joel Mayer Date: 11/21/2024 MRN: [id number] DOB: 1976/10/25 PCP: Joel Mayer  Time spent: 8:00 AM - 8:58 AM   Annual Review: 04/25/2025   Today I met with  Joel Mayer in remote video ([facility]) face-to-face individual psychotherapy.   Distance Site: Client's Home Orginating Site: Dr Edison Remote Office Consent: Obtained verbal consent to transmit session remotely. Patient is aware of the inherent limitations in participating in virtual therapy.      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  He states that in the Fall and winter he experiences a pattern of depression consistent with SAD.  While he feels that things are better, he would like some assistance and moving forward.  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  He has been prescribed Prozac 60 mg 17 years.  He was in therapy with Dr. Arzella until her retirement.  Dr. Arzella referred Pt to this therapist.    Background History: Pt is married to Joel Mayer (48) married since September of 07.  This is a first marriage for both of them. They have two daughters together. 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  Pt reports periodic conflicts with wife and need to be more involved with his daughters.  Father is now deceased and he has an aging mother with whom he has a conflictual relationship.  Father had a long history of depression which went untreated most of his life.  His death was sudden and unexpected.  This year was a difficult one as he struggled to grieve, review their history together and changing dynamics with his mother as a result of this lose.  Mental Status Exam:  Appearance: Casual    Behavior: Appropriate Motor: Normal Speech/Language: Normal Rate Affect: Appropriate Mood: normal Thought process: normal Thought content: WNL Sensory/Perceptual disturbances: WNL Orientation: oriented to  person Attention: Good Concentration: Good Memory: Immediate;   Good Fund of knowledge: Good Insight: Good Judgment: Good Impulse Control: Good   Individualized Treatment Plan                 Strengths: Intelligent, resourceful, loyal, good problem solver, persistent, deals with adversity, friendly, humorous  Supports: spouse, friends (mostly out of town), mother   Goal/Needs for Treatment:  In order of importance to patient 1) Learn and implement mindfulness techniques for the purposes of relapse prevention. 2) Increased skills, effectiveness, and confidence in parenting 3) For interpersonal deficits, help the client develop new interpersonal skills and relationships.  4) Develop healthy interpersonal relationships that lead to the alleviation and help prevent  the relapse of depression.   Client Statement of Needs: Requires assistance of being more aware and mindful in his day to day life experience especially as his mood and behaviors impact his relationships with his wife and children.  A focus on relapse prevention related to depression and maintenance, .   Treatment Level: Biweekly Individual Outpatient Psychotherapy  Symptoms:  History of chronic or recurrent depression for which the client takes antidepressant medication, diminished interest in or enjoyment of activities, hypersensitivity to the criticism or disapproval of others, Reluctant involvement in social situations out of fear of saying or doing something foolish or of becoming emotional in front of others, reports difficulty in managing the challenging problem behavior of their child, social withdrawal. Displays deficits in parenting knowledge and skills and lacks knowledge regarding reasonable  expectations for a child's behavior at a given developmental level.  Joel Mayer.  Client Treatment Preferences: continue treatment with current therapist   [name]:  Psychologist, [name], [name] will support the patient's ability to achieve the goals identified. Cognitive Behavioral Therapy, Dialectical Behavioral Therapy, Motivational Interviewing, behavior activation, parent training, and other evidenced-based practices will be used to promote progress towards healthy functioning.   Healthcare consumer [name] will: Actively participate in therapy, working towards healthy functioning.    *Justification for Continuation/Discontinuation of Goal: R=Revised, O=Ongoing, A=Achieved, D=Discontinued  Goal 1) Learn and implement mindfulness techniques for the purposes of relapse prevention.  5 Point Likert rating baseline date: 04/29/2021 Target Date Goal Was reviewed Status Code Progress towards goal/Likert rating  04/30/2023 04/28/2022          O 1/5 - pt has only a minimal level of the self awareness necessary to activate coping skills in a timely manner, unable to identify emotions  05/10/2024 05/11/2023          O 2/5 - pt demonstrates minimal progress, shows little self awareness necessary to activate coping skills in a timely manner, unable to identify emotions  04/25/2025 04/25/2024          O 2.5/5 - pt demonstrates minimal progress, minor growth in self awareness necessary to activate coping skills in a timely manner, unable to identify emotions   Goal 2) Increased skills, effectiveness, and confidence in parenting  5 Point Likert rating baseline date: 04/29/2021 Target Date Goal Was reviewed Status Code Progress towards goal/Likert rating  04/30/2023 04/28/2022          O 2/5 - increased awareness of children's needs, increased level of attachment and engagement  05/10/2024 05/11/2023          O 4/5 - demonstrated the most progress in feeling more effective and engaged as a parent  04/25/2025 04/25/2024          O 4.5/5 - increased awareness of the needs of adolescent girls, continues to increase level of attachment and engagement   Goal 3) For interpersonal deficits, help  the client develop new interpersonal skills, decrease social anxiety, and relationships.   5 Point Likert rating baseline date: 04/29/2021 Target Date Goal Was reviewed Status Code Progress towards goal/Likert rating   04/30/2023  04/28/2022           O  1/5 - pt continues to avoid social opportunities, makes little effort to mobilize around goal to improve interpersonal interactions outside of immediate family   05/10/2024  05/11/2023           O  1/5 - pt made no progress, see need to focus on this area of his life this year   04/25/2025  04/25/2024           O  1.5/5 - pt made the least amount of progress in this area, and admits he put little effort here and is trying focus more on social relationship this year   Goal 4) Develop healthy interpersonal relationships that lead to the alleviation and help prevent  the relapse of depression.    5 Point Likert rating baseline date: 04/29/2021 Target Date Goal Was reviewed Status Code Progress towards goal/Likert rating  04/30/2023 04/28/2022          O 1/5 - pt continues to avoid social opportunities, makes little effort to mobilize around goal to improve interpersonal interactions outside of immediate family  05/10/2024 05/11/2023  O 1/5 - pt made no progress, see need to focus on this area of his life this year  04/25/2025 04/25/2024          O 1.5/5 - pt made the least amount of progress in this area, and admits he put little effort here and is trying focus more on social relationship this year   Goal 4) Alleviate work dissatisfaction which is a significant source of stress  5 Point Likert rating baseline date: 04/25/2024 Target Date Goal Was reviewed Status Code Progress towards goal/Likert rating  04/25/2025 04/25/2024          N                       Goal 5) Phase of Life change (only adult child of an aging parent)  5 Point Likert rating baseline date: 04/25/2024 Target Date Goal Was reviewed Status Code Progress towards goal/Likert rating   04/25/2025 04/25/2024          N                        This plan has been reviewed and created by the following participants:  This plan will be reviewed at least every 12 months. Date Behavioral Health Clinician Date Guardian/Patient   04/28/2022  [name], [name]   04/28/2022 Joel Mayer  05/11/2023 [name], [name]  05/11/2023 Joel Mayer  04/25/2024 [name], [name]  04/25/2024 Joel Mayer          Diagnosis:  Generalized Anxiety Disorder Major Depressive Disorder, recurrent, in partial remission   Banks reports that the family has been well and healthy this week after a few weeks of unpleasant health issues.  We d/e/p the need for better sleep, returning to healthy lifestyle routines that support his physical and mental health, and learning to flex (be less black-and-white) in his approach to these routines when times are challenging.  I provided some psycho education related to habit formation and methods to facilitate initiation.  Home Practice: daily journaling on prompts d/ in therapy, use phot therapy lamp 1x/day for 15-20 minutes in the morning  [name], PhD   ========================================  Wife: Powell Daughters:  Joel Mayer - Junior @ [employer] [place] - Freshman @ [employer]  "

## 2024-11-29 ENCOUNTER — Encounter: Payer: Self-pay | Admitting: Internal Medicine

## 2024-11-29 ENCOUNTER — Other Ambulatory Visit: Payer: Self-pay | Admitting: Internal Medicine

## 2024-11-29 DIAGNOSIS — J3489 Other specified disorders of nose and nasal sinuses: Secondary | ICD-10-CM

## 2024-12-05 ENCOUNTER — Ambulatory Visit: Admitting: Psychology

## 2024-12-07 ENCOUNTER — Ambulatory Visit
Admission: RE | Admit: 2024-12-07 | Discharge: 2024-12-07 | Disposition: A | Payer: Self-pay | Source: Ambulatory Visit | Attending: Internal Medicine | Admitting: Internal Medicine

## 2024-12-07 DIAGNOSIS — J3489 Other specified disorders of nose and nasal sinuses: Secondary | ICD-10-CM

## 2024-12-19 ENCOUNTER — Ambulatory Visit: Admitting: Psychology

## 2025-01-02 ENCOUNTER — Ambulatory Visit: Admitting: Psychology

## 2025-01-16 ENCOUNTER — Ambulatory Visit: Admitting: Psychology

## 2025-01-30 ENCOUNTER — Ambulatory Visit: Admitting: Psychology

## 2025-02-13 ENCOUNTER — Ambulatory Visit: Admitting: Psychology

## 2025-02-27 ENCOUNTER — Ambulatory Visit: Admitting: Psychology

## 2025-03-13 ENCOUNTER — Ambulatory Visit: Admitting: Psychology

## 2025-03-27 ENCOUNTER — Ambulatory Visit: Admitting: Psychology

## 2025-04-10 ENCOUNTER — Ambulatory Visit: Admitting: Psychology

## 2025-04-24 ENCOUNTER — Ambulatory Visit: Admitting: Psychology

## 2025-05-08 ENCOUNTER — Ambulatory Visit: Admitting: Psychology

## 2025-05-22 ENCOUNTER — Ambulatory Visit: Admitting: Psychology

## 2025-06-05 ENCOUNTER — Ambulatory Visit: Admitting: Psychology
